# Patient Record
Sex: Female | Born: 1980 | Race: White | Hispanic: Yes | Smoking: Never smoker
Health system: Southern US, Community
[De-identification: ages and names within clinical notes are randomized; demographics above are authoritative.]

## PROBLEM LIST (undated history)

## (undated) DIAGNOSIS — D649 Anemia, unspecified: Secondary | ICD-10-CM

## (undated) DIAGNOSIS — C539 Malignant neoplasm of cervix uteri, unspecified: Secondary | ICD-10-CM

## (undated) HISTORY — DX: Anemia, unspecified: D64.9

## (undated) HISTORY — DX: Malignant neoplasm of cervix uteri, unspecified: C53.9

---

## 2005-03-06 ENCOUNTER — Other Ambulatory Visit: Admission: RE | Admit: 2005-03-06 | Discharge: 2005-03-06 | Payer: Self-pay | Admitting: Obstetrics and Gynecology

## 2005-05-15 ENCOUNTER — Inpatient Hospital Stay (HOSPITAL_COMMUNITY): Admission: AD | Admit: 2005-05-15 | Discharge: 2005-05-15 | Payer: Self-pay | Admitting: *Deleted

## 2005-05-15 ENCOUNTER — Ambulatory Visit: Payer: Self-pay | Admitting: Obstetrics and Gynecology

## 2005-07-15 ENCOUNTER — Ambulatory Visit: Payer: Self-pay | Admitting: Family Medicine

## 2005-07-15 ENCOUNTER — Inpatient Hospital Stay (HOSPITAL_COMMUNITY): Admission: AD | Admit: 2005-07-15 | Discharge: 2005-07-17 | Payer: Self-pay | Admitting: *Deleted

## 2009-05-25 ENCOUNTER — Inpatient Hospital Stay (HOSPITAL_COMMUNITY): Admission: AD | Admit: 2009-05-25 | Discharge: 2009-05-27 | Payer: Self-pay | Admitting: Obstetrics
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
## 2011-03-02 LAB — CBC
HCT: 24.9 % — ABNORMAL LOW (ref 36.0–46.0)
HCT: 38.2 % (ref 36.0–46.0)
Hemoglobin: 13.4 g/dL (ref 12.0–15.0)
Hemoglobin: 8.8 g/dL — ABNORMAL LOW (ref 12.0–15.0)
MCHC: 34.9 g/dL (ref 30.0–36.0)
MCHC: 35.3 g/dL (ref 30.0–36.0)
MCV: 95.3 fL (ref 78.0–100.0)
MCV: 96.8 fL (ref 78.0–100.0)
RBC: 4.01 MIL/uL (ref 3.87–5.11)
RDW: 12.3 % (ref 11.5–15.5)
WBC: 8.6 10*3/uL (ref 4.0–10.5)

## 2022-12-17 ENCOUNTER — Emergency Department (HOSPITAL_COMMUNITY)
Admission: EM | Admit: 2022-12-17 | Discharge: 2022-12-17 | Disposition: A | Discharge status: Home / Self Care | Payer: No Typology Code available for payment source | Attending: Emergency Medicine | Admitting: Emergency Medicine

## 2022-12-17 ENCOUNTER — Emergency Department (HOSPITAL_COMMUNITY): Payer: No Typology Code available for payment source

## 2022-12-17 ENCOUNTER — Encounter (HOSPITAL_COMMUNITY): Payer: Self-pay

## 2022-12-17 ENCOUNTER — Other Ambulatory Visit: Payer: Self-pay

## 2022-12-17 DIAGNOSIS — S39012A Strain of muscle, fascia and tendon of lower back, initial encounter: Secondary | ICD-10-CM

## 2022-12-17 DIAGNOSIS — M545 Low back pain, unspecified: Secondary | ICD-10-CM | POA: Diagnosis not present

## 2022-12-17 DIAGNOSIS — Y92410 Unspecified street and highway as the place of occurrence of the external cause: Secondary | ICD-10-CM | POA: Insufficient documentation

## 2022-12-17 DIAGNOSIS — D649 Anemia, unspecified: Secondary | ICD-10-CM

## 2022-12-17 LAB — CBC WITH DIFFERENTIAL/PLATELET
Abs Immature Granulocytes: 0.02 10*3/uL (ref 0.00–0.07)
Basophils Absolute: 0 10*3/uL (ref 0.0–0.1)
Basophils Relative: 0 %
Eosinophils Absolute: 0 10*3/uL (ref 0.0–0.5)
Eosinophils Relative: 1 %
HCT: 26.3 % — ABNORMAL LOW (ref 36.0–46.0)
Hemoglobin: 6.9 g/dL — CL (ref 12.0–15.0)
Immature Granulocytes: 0 %
Lymphocytes Relative: 32 %
Lymphs Abs: 2 10*3/uL (ref 0.7–4.0)
MCH: 17.3 pg — ABNORMAL LOW (ref 26.0–34.0)
MCHC: 26.2 g/dL — ABNORMAL LOW (ref 30.0–36.0)
MCV: 65.9 fL — ABNORMAL LOW (ref 80.0–100.0)
Monocytes Absolute: 0.5 10*3/uL (ref 0.1–1.0)
Monocytes Relative: 9 %
Neutro Abs: 3.6 10*3/uL (ref 1.7–7.7)
Neutrophils Relative %: 58 %
Platelets: 396 10*3/uL (ref 150–400)
RBC: 3.99 MIL/uL (ref 3.87–5.11)
RDW: 20.5 % — ABNORMAL HIGH (ref 11.5–15.5)
WBC: 6.2 10*3/uL (ref 4.0–10.5)
nRBC: 0 % (ref 0.0–0.2)

## 2022-12-17 LAB — BASIC METABOLIC PANEL
Anion gap: 9 (ref 5–15)
BUN: 7 mg/dL (ref 6–20)
CO2: 22 mmol/L (ref 22–32)
Calcium: 8.9 mg/dL (ref 8.9–10.3)
Chloride: 102 mmol/L (ref 98–111)
Creatinine, Ser: 0.43 mg/dL — ABNORMAL LOW (ref 0.44–1.00)
GFR, Estimated: >60 mL/min (ref >60)
Glucose, Bld: 98 mg/dL (ref 70–99)
Potassium: 3.3 mmol/L — ABNORMAL LOW (ref 3.5–5.1)
Sodium: 133 mmol/L — ABNORMAL LOW (ref 135–145)

## 2022-12-17 LAB — I-STAT BETA HCG BLOOD, ED (MC, WL, AP ONLY): I-stat hCG, quantitative: <5 m[IU]/mL (ref <5)

## 2022-12-17 MED ORDER — METHOCARBAMOL 500 MG PO TABS: 500.0000 mg | ORAL_TABLET | Freq: Two times a day (BID) | ORAL | 0 refills | Status: DC

## 2022-12-17 MED ORDER — FERROUS SULFATE 325 (65 FE) MG PO TABS: 325.0000 mg | ORAL_TABLET | Freq: Every day | ORAL | 0 refills | Status: DC

## 2022-12-17 MED ORDER — LIDOCAINE 5 % EX PTCH: 1.0000 | MEDICATED_PATCH | CUTANEOUS | 0 refills | Status: DC
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
## 2024-11-18 ENCOUNTER — Emergency Department (HOSPITAL_COMMUNITY): Payer: Self-pay

## 2024-11-18 ENCOUNTER — Observation Stay (HOSPITAL_COMMUNITY)
Admission: EM | Admit: 2024-11-18 | Discharge: 2024-11-19 | Disposition: A | Discharge status: Home / Self Care | Payer: Self-pay | Attending: Obstetrics & Gynecology | Admitting: Obstetrics & Gynecology

## 2024-11-18 ENCOUNTER — Other Ambulatory Visit: Payer: Self-pay

## 2024-11-18 DIAGNOSIS — Z8541 Personal history of malignant neoplasm of cervix uteri: Secondary | ICD-10-CM | POA: Insufficient documentation

## 2024-11-18 DIAGNOSIS — R1900 Intra-abdominal and pelvic swelling, mass and lump, unspecified site: Secondary | ICD-10-CM | POA: Insufficient documentation

## 2024-11-18 DIAGNOSIS — D500 Iron deficiency anemia secondary to blood loss (chronic): Secondary | ICD-10-CM

## 2024-11-18 DIAGNOSIS — N921 Excessive and frequent menstruation with irregular cycle: Secondary | ICD-10-CM | POA: Diagnosis present

## 2024-11-18 DIAGNOSIS — D649 Anemia, unspecified: Principal | ICD-10-CM | POA: Diagnosis present

## 2024-11-18 DIAGNOSIS — N939 Abnormal uterine and vaginal bleeding, unspecified: Principal | ICD-10-CM

## 2024-11-18 DIAGNOSIS — D509 Iron deficiency anemia, unspecified: Secondary | ICD-10-CM | POA: Diagnosis present

## 2024-11-18 LAB — COMPREHENSIVE METABOLIC PANEL WITH GFR
ALT: 95 U/L — ABNORMAL HIGH (ref 0–44)
AST: 74 U/L — ABNORMAL HIGH (ref 15–41)
Albumin: 4 g/dL (ref 3.5–5.0)
Alkaline Phosphatase: 97 U/L (ref 38–126)
Anion gap: 9 (ref 5–15)
BUN: 9 mg/dL (ref 6–20)
CO2: 24 mmol/L (ref 22–32)
Calcium: 8.7 mg/dL — ABNORMAL LOW (ref 8.9–10.3)
Chloride: 103 mmol/L (ref 98–111)
Creatinine, Ser: 0.42 mg/dL — ABNORMAL LOW (ref 0.44–1.00)
GFR, Estimated: >60 mL/min
Glucose, Bld: 102 mg/dL — ABNORMAL HIGH (ref 70–99)
Potassium: 3.7 mmol/L (ref 3.5–5.1)
Sodium: 136 mmol/L (ref 135–145)
Total Bilirubin: 0.4 mg/dL (ref 0.0–1.2)
Total Protein: 7.7 g/dL (ref 6.5–8.1)

## 2024-11-18 LAB — CBC WITH DIFFERENTIAL/PLATELET
Abs Immature Granulocytes: 0.05 K/uL (ref 0.00–0.07)
Basophils Absolute: 0 K/uL (ref 0.0–0.1)
Basophils Relative: 0 %
Eosinophils Absolute: 0.1 K/uL (ref 0.0–0.5)
Eosinophils Relative: 1 %
HCT: 21.6 % — ABNORMAL LOW (ref 36.0–46.0)
Hemoglobin: 5.4 g/dL — CL (ref 12.0–15.0)
Immature Granulocytes: 1 %
Lymphocytes Relative: 19 %
Lymphs Abs: 1.5 K/uL (ref 0.7–4.0)
MCH: 17.1 pg — ABNORMAL LOW (ref 26.0–34.0)
MCHC: 25 g/dL — ABNORMAL LOW (ref 30.0–36.0)
MCV: 68.4 fL — ABNORMAL LOW (ref 80.0–100.0)
Monocytes Absolute: 0.5 K/uL (ref 0.1–1.0)
Monocytes Relative: 6 %
Neutro Abs: 5.9 K/uL (ref 1.7–7.7)
Neutrophils Relative %: 73 %
Platelets: 311 K/uL (ref 150–400)
RBC: 3.16 MIL/uL — ABNORMAL LOW (ref 3.87–5.11)
RDW: 20.5 % — ABNORMAL HIGH (ref 11.5–15.5)
WBC: 8.1 K/uL (ref 4.0–10.5)
nRBC: 0.2 % (ref 0.0–0.2)

## 2024-11-18 LAB — HEMOGLOBIN AND HEMATOCRIT, BLOOD
HCT: 20.4 % — ABNORMAL LOW (ref 36.0–46.0)
Hemoglobin: 5.2 g/dL — CL (ref 12.0–15.0)

## 2024-11-18 LAB — HCG, SERUM, QUALITATIVE: Preg, Serum: NEGATIVE

## 2024-11-18 LAB — ABO/RH: ABO/RH(D): O POS

## 2024-11-18 LAB — PREPARE RBC (CROSSMATCH)

## 2024-11-18 MED ORDER — SODIUM CHLORIDE 0.9% IV SOLUTION: Freq: Once | INTRAVENOUS | Status: DC

## 2024-11-18 NOTE — ED Triage Notes (Signed)
 PT complains of extremely heavy menstrual cycle x 4 days with SOB and dizziness. Pt has been wearing a diaper due to filling pads so freq.

## 2024-11-18 NOTE — ED Triage Notes (Signed)
 Patient reports 4 days of heavy menstrual bleeding with no abdominal pain, patient wearing depends at this time d/t amount of bleeding, unable to use pads b/c they filled up so fast. Patient also dizzy.

## 2024-11-18 NOTE — ED Provider Notes (Signed)
 " [place] EMERGENCY DEPARTMENT AT [facility] Provider Note   CSN: [id number] Arrival date & time: 11/18/24  2002     Patient presents with: Vaginal Bleeding   Yolanda Braun is a 43 y.o. female.   43 yo female presents her daughter who provides Spanish translation (patient declines hospital translator at this time) with concern for heavy vaginal bleeding x 4 days. Reports this cycle she is passing larger clots than normal, wearing diapers due to quantity of blood and large clots. Feels dizzy when she is having bleeding/passing a clot otherwise is not SHOB, dizzy, or having CP. Reports cycles typically every month, lasting 7 days. Does not see GYN.        Prior to Admission medications  Medication Sig Start Date End Date Taking? Authorizing Provider  acetaminophen (TYLENOL) 500 MG tablet Take 1,000 mg by mouth every 6 (six) hours as needed for headache or mild pain (pain score 1-3).   Yes [provider]  ibuprofen (ADVIL) 200 MG tablet Take 600 mg by mouth every 6 (six) hours as needed for headache, mild pain (pain score 1-3) or cramping.   Yes [provider]  Multiple Vitamins-Minerals (HAIR/SKIN/NAILS) TABS Take 1 tablet by mouth in the morning.   Yes [provider]  OVER THE COUNTER MEDICATION Take 1 Scoop by mouth in the morning. BLOOM GREENS AND SUPERFOODS: for digestion, bloating, and energy   Yes [provider]    Allergies: Patient has no known allergies.    Review of Systems Negative except as per HPI Updated Vital Signs BP 122/75   Pulse 88   Temp 98.2 F (36.8 C) (Oral)   Resp (!) 25   Wt 101 kg   SpO2 100%   BMI 43.49 kg/m   Physical Exam Vitals and nursing note reviewed. Exam conducted with a chaperone present.  Constitutional:      General: She is not in acute distress.    Appearance: She is well-developed. She is not diaphoretic.  HENT:     Head: Normocephalic and atraumatic.  Cardiovascular:      Rate and Rhythm: Regular rhythm. Tachycardia present.     Heart sounds: Normal heart sounds.  Pulmonary:     Effort: Pulmonary effort is normal.     Breath sounds: Normal breath sounds.  Abdominal:     Palpations: Abdomen is soft.     Tenderness: There is no abdominal tenderness.  Genitourinary:    Comments: Exam limited by habitus, appears to have irregular tissue/mass extending off the cervix posteriorly, difficult to visualize on exam. Not significantly bleeding at this time.  Skin:    General: Skin is warm and dry.     Findings: No erythema or rash.  Neurological:     Mental Status: She is alert and oriented to person, place, and time.  Psychiatric:        Behavior: Behavior normal.     (all labs ordered are listed, but only abnormal results are displayed) Labs Reviewed  CBC WITH DIFFERENTIAL/PLATELET - Abnormal; Notable for the following components:      Result Value   RBC 3.16 (*)    Hemoglobin 5.4 (*)    HCT 21.6 (*)    MCV 68.4 (*)    MCH 17.1 (*)    MCHC 25.0 (*)    RDW 20.5 (*)    All other components within normal limits  HEMOGLOBIN AND HEMATOCRIT, BLOOD - Abnormal; Notable for the following components:   Hemoglobin 5.2 (*)  HCT 20.4 (*)    All other components within normal limits  COMPREHENSIVE METABOLIC PANEL WITH GFR - Abnormal; Notable for the following components:   Glucose, Bld 102 (*)    Creatinine, Ser 0.42 (*)    Calcium 8.7 (*)    AST 74 (*)    ALT 95 (*)    All other components within normal limits  HCG, SERUM, QUALITATIVE  URINALYSIS, ROUTINE W REFLEX MICROSCOPIC  TYPE AND SCREEN  ABO/RH  PREPARE RBC (CROSSMATCH)    EKG: None  Radiology: US  PELVIC COMPLETE WITH TRANSVAGINAL Result Date: 11/19/2024 EXAM: US  Pelvis, Complete Transvaginal and Transabdominal without Doppler TECHNIQUE: Transabdominal and transvaginal pelvic duplex ultrasound using B-mode/gray scaled imaging without Doppler spectral analysis and color flow was obtained.  COMPARISON: None provided CLINICAL HISTORY: Vaginal bleeding, anemia. FINDINGS: UTERUS: Uterus measures 9.2 x 4.8 x 6.1 cm. Uterus demonstrates normal myometrial echotexture. There is a 3.5 cm focal hypodensity area within the cervical region measuring 3.3 x 2.2 x 5.0 cm. Additionally, there are nabothian cysts within the cervix. ENDOMETRIAL STRIPE: Endometrium measures 0.9 cm. Endometrial stripe is within normal limits. RIGHT OVARY: Right ovary measures 3.9 x 2.8 x 2.6 cm. There are cystic areas in the right ovary measuring up to 2.3 cm. LEFT OVARY: Left ovary measures 5.8 x 3.9 x 4.4 cm. There is a well circumscribed homogeneous isoechoic structure within the left ovary measuring 5.2 x 3.2 x 3.5 cm without significant internal vascularity. FREE FLUID: No free fluid. IMPRESSION: 1. Indeterminate well-circumscribed homogeneous avascular left ovarian lesion measuring 5.2 x 3.2 x 3.5 cm, with pelvic MRI with IV contrast recommended for further characterization. 2. Indeterminate cervical region lesion measuring up to 5.0 cm, with gynecology evaluation and/or pelvic MRI with IV contrast recommended for further characterization. 3. Small right ovarian cysts measuring up to 2.3 cm, compatible with follicles and requiring no follow-up imaging. Electronically signed by: Greig Pique MD 11/19/2024 12:39 AM EST RP Workstation: [id number]     .Critical Care  Performed by: Beverley Leita LABOR, PA-C Authorized by: Beverley Leita LABOR, PA-C   Critical care provider statement:    Critical care time (minutes):  30   Critical care was time spent personally by me on the following activities:  Development of treatment plan with patient or surrogate, discussions with consultants, evaluation of patient's response to treatment, examination of patient, ordering and review of laboratory studies, ordering and review of radiographic studies, ordering and performing treatments and interventions, pulse oximetry, re-evaluation of patient's  condition and review of old charts    Medications Ordered in the ED  0.9 %  sodium chloride  infusion (Manually program via Guardrails IV Fluids) (has no administration in time range)  conjugated estrogens  (PREMARIN ) injection 25 mg (has no administration in time range)  megestrol  (MEGACE ) tablet 120 mg (120 mg Oral Given 11/19/24 0229)                                     Medical Decision Making Amount and/or Complexity of Data Reviewed Labs: ordered. Radiology: ordered.  Risk Prescription drug management. Decision regarding hospitalization.   This patient presents to the ED for concern of vaginal bleeding, this involves an extensive number of treatment options, and is a complaint that carries with it a high risk of complications and morbidity.  The differential diagnosis includes but not limited to symptomatic anemia, acute blood loss anemia, cervical mass   Co morbidities /  Chronic conditions that complicate the patient evaluation  Anemia, history of heavy menstrual cycles   Additional history obtained:  Additional history obtained from EMR External records from outside source obtained and reviewed including prior labs on file   Lab Tests:  I Ordered, and personally interpreted labs.  The pertinent results include: CBC with hemoglobin of 5.4, stable on recheck at 5.2.  CMP with mildly elevated LFTs with AST of 74 and ALT of 95.   Imaging Studies ordered:  I ordered imaging studies including pelvic US   I independently visualized and interpreted imaging which showed mass at cervix  I agree with the radiologist interpretation    Problem List / ED Course / Critical interventions / Medication management  43 yo female here with daughter for heavy vaginal bleeding, heavier and with larger clots that her typical, wearing pads today, feeling dizzy when she has episodes of heavy blood loss. Vitals stable. Anemic at baseline, now with hgb 5.4. Consents to blood transfusion.  Found to have mass on her cervix on exam, US  with concern for mass as well as lesion at left ovary. Admitted for transfusion and further care. Discussed results with patient with family at bedside with her consent and former translator assistance.  I ordered medication including premarin , megestrol , PRBC   Reevaluation of the patient after these medicines showed that the patient stable I have reviewed the patients home medicines and have made adjustments as needed   Consultations Obtained:  I requested consultation with the Dr. Jayne,  and discussed lab and imaging findings as well as pertinent plan - they recommend: admit, IV Premarin  25mg , PO Megestrol  120mg    Social Determinants of Health:  No PCP, lives with family   Test / Admission - Considered:  Admit      Final diagnoses:  Vaginal bleeding  Pelvic mass  Blood loss anemia    ED Discharge Orders     None          Beverley Leita LABOR, PA-C 11/19/24 0246    Theadore Ozell HERO, MD 11/19/24 [phone]  "

## 2024-11-18 NOTE — ED Notes (Signed)
Patient going to Ultrasound

## 2024-11-19 ENCOUNTER — Other Ambulatory Visit (HOSPITAL_COMMUNITY): Payer: Self-pay

## 2024-11-19 ENCOUNTER — Encounter (HOSPITAL_COMMUNITY): Payer: Self-pay | Admitting: Obstetrics & Gynecology

## 2024-11-19 DIAGNOSIS — D500 Iron deficiency anemia secondary to blood loss (chronic): Secondary | ICD-10-CM

## 2024-11-19 DIAGNOSIS — D509 Iron deficiency anemia, unspecified: Secondary | ICD-10-CM | POA: Diagnosis present

## 2024-11-19 DIAGNOSIS — D649 Anemia, unspecified: Secondary | ICD-10-CM | POA: Diagnosis present

## 2024-11-19 DIAGNOSIS — C531 Malignant neoplasm of exocervix: Secondary | ICD-10-CM

## 2024-11-19 DIAGNOSIS — N921 Excessive and frequent menstruation with irregular cycle: Secondary | ICD-10-CM

## 2024-11-19 LAB — CBC
HCT: 26.1 % — ABNORMAL LOW (ref 36.0–46.0)
Hemoglobin: 7.6 g/dL — ABNORMAL LOW (ref 12.0–15.0)
MCH: 20.7 pg — ABNORMAL LOW (ref 26.0–34.0)
MCHC: 29.1 g/dL — ABNORMAL LOW (ref 30.0–36.0)
MCV: 71.1 fL — ABNORMAL LOW (ref 80.0–100.0)
Platelets: 280 K/uL (ref 150–400)
RBC: 3.67 MIL/uL — ABNORMAL LOW (ref 3.87–5.11)
RDW: 22.5 % — ABNORMAL HIGH (ref 11.5–15.5)
WBC: 6.8 K/uL (ref 4.0–10.5)
nRBC: 0.4 % — ABNORMAL HIGH (ref 0.0–0.2)

## 2024-11-19 MED ORDER — ESTROGENS CONJUGATED 25 MG IJ SOLR
25.0000 mg | Freq: Four times a day (QID) | INTRAMUSCULAR | Status: DC
  Administered 2024-11-19: 25 mg via INTRAVENOUS
  Filled 2024-11-19 (×3): qty 25

## 2024-11-19 MED ORDER — MEGESTROL ACETATE 40 MG PO TABS
120.0000 mg | ORAL_TABLET | Freq: Every day | ORAL | Status: DC
  Filled 2024-11-19: qty 3

## 2024-11-19 MED ORDER — PRENATAL MULTIVITAMIN CH: 1.0000 | ORAL_TABLET | Freq: Every day | ORAL | Status: DC

## 2024-11-19 MED ORDER — MEGESTROL ACETATE 40 MG PO TABS
120.0000 mg | ORAL_TABLET | Freq: Every day | ORAL | 1 refills | Status: AC
  Filled 2024-11-19: qty 90, 30d supply, fill #0

## 2024-11-19 MED ORDER — ESTROGENS CONJUGATED 25 MG IJ SOLR
25.0000 mg | Freq: Once | INTRAMUSCULAR | Status: AC
  Administered 2024-11-19: 25 mg via INTRAVENOUS
  Filled 2024-11-19: qty 25

## 2024-11-19 MED ORDER — MEGESTROL ACETATE 40 MG PO TABS
120.0000 mg | ORAL_TABLET | Freq: Once | ORAL | Status: AC
  Administered 2024-11-19: 120 mg via ORAL
  Filled 2024-11-19: qty 3

## 2024-11-19 NOTE — Progress Notes (Signed)
 Patient ID: Yolanda Braun, female   DOB: August 18, 1981, 43 y.o.   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

## 2024-11-19 NOTE — Plan of Care (Signed)
" °  Problem: Education: Goal: Knowledge of General Education information will improve Description: Including pain rating scale, medication(s)/side effects and non-pharmacologic comfort measures 11/19/2024 1536 by Knute Suzen RAMAN, RN Outcome: Completed/Met 11/19/2024 1223 by Knute Suzen RAMAN, RN Outcome: Progressing 11/19/2024 0855 by Knute Suzen RAMAN, RN Outcome: Progressing   Problem: Health Behavior/Discharge Planning: Goal: Ability to manage health-related needs will improve 11/19/2024 1536 by Knute Suzen RAMAN, RN Outcome: Completed/Met 11/19/2024 1223 by Knute Suzen RAMAN, RN Outcome: Progressing 11/19/2024 0855 by Knute Suzen RAMAN, RN Outcome: Progressing   Problem: Clinical Measurements: Goal: Ability to maintain clinical measurements within normal limits will improve 11/19/2024 1536 by Knute Suzen RAMAN, RN Outcome: Completed/Met 11/19/2024 1223 by Knute Suzen RAMAN, RN Outcome: Progressing 11/19/2024 0855 by Knute Suzen RAMAN, RN Outcome: Progressing Goal: Will remain free from infection 11/19/2024 1536 by Knute Suzen RAMAN, RN Outcome: Completed/Met 11/19/2024 1223 by Knute Suzen RAMAN, RN Outcome: Progressing 11/19/2024 0855 by Knute Suzen RAMAN, RN Outcome: Progressing Goal: Diagnostic test results will improve 11/19/2024 1536 by Knute Suzen RAMAN, RN Outcome: Completed/Met 11/19/2024 1223 by Knute Suzen RAMAN, RN Outcome: Progressing 11/19/2024 0855 by Knute Suzen RAMAN, RN Outcome: Progressing Goal: Respiratory complications will improve 11/19/2024 1536 by Knute Suzen RAMAN, RN Outcome: Completed/Met 11/19/2024 1223 by Knute Suzen RAMAN, RN Outcome: Progressing 11/19/2024 0855 by Knute Suzen RAMAN, RN Outcome: Progressing Goal: Cardiovascular complication will be avoided 11/19/2024 1536 by Knute Suzen RAMAN, RN Outcome: Completed/Met 11/19/2024 1223 by Knute Suzen RAMAN, RN Outcome: Progressing 11/19/2024 0855 by Knute Suzen RAMAN, RN Outcome: Progressing   Problem: Activity: Goal: Risk for activity intolerance will decrease 11/19/2024 1536 by Knute Suzen RAMAN, RN Outcome: Completed/Met 11/19/2024 1223 by Knute Suzen RAMAN, RN Outcome: Progressing 11/19/2024 0855 by Knute Suzen RAMAN, RN Outcome: Progressing   Problem: Nutrition: Goal: Adequate nutrition will be maintained 11/19/2024 1536 by Knute Suzen RAMAN, RN Outcome: Completed/Met 11/19/2024 1223 by Knute Suzen RAMAN, RN Outcome: Progressing 11/19/2024 0855 by Knute Suzen RAMAN, RN Outcome: Progressing   Problem: Coping: Goal: Level of anxiety will decrease 11/19/2024 1536 by Knute Suzen RAMAN, RN Outcome: Completed/Met 11/19/2024 1223 by Knute Suzen RAMAN, RN Outcome: Progressing 11/19/2024 0855 by Knute Suzen RAMAN, RN Outcome: Progressing   Problem: Elimination: Goal: Will not experience complications related to bowel motility 11/19/2024 1536 by Knute Suzen RAMAN, RN Outcome: Completed/Met 11/19/2024 1223 by Knute Suzen RAMAN, RN Outcome: Progressing 11/19/2024 0855 by Knute Suzen RAMAN, RN Outcome: Progressing Goal: Will not experience complications related to urinary retention 11/19/2024 1536 by Knute Suzen RAMAN, RN Outcome: Completed/Met 11/19/2024 1223 by Knute Suzen RAMAN, RN Outcome: Progressing 11/19/2024 0855 by Knute Suzen RAMAN, RN Outcome: Progressing   Problem: Pain Managment: Goal: General experience of comfort will improve and/or be controlled 11/19/2024 1536 by Knute Suzen RAMAN, RN Outcome: Completed/Met 11/19/2024 1223 by Knute Suzen RAMAN, RN Outcome: Progressing 11/19/2024 0855 by Knute Suzen RAMAN, RN Outcome: Progressing   Problem: Safety: Goal: Ability to remain free from injury will improve 11/19/2024 1536 by Knute Suzen RAMAN, RN Outcome: Completed/Met 11/19/2024 1223 by Knute Suzen RAMAN, RN Outcome: Progressing 11/19/2024 0855 by Knute Suzen RAMAN, RN Outcome:  Progressing   Problem: Skin Integrity: Goal: Risk for impaired skin integrity will decrease 11/19/2024 1536 by Knute Suzen RAMAN, RN Outcome: Completed/Met 11/19/2024 1223 by Knute Suzen RAMAN, RN Outcome: Progressing 11/19/2024 0855 by Knute Suzen RAMAN, RN Outcome: Progressing   "

## 2024-11-19 NOTE — Plan of Care (Signed)
" °  Problem: Education: Goal: Knowledge of General Education information will improve Description: Including pain rating scale, medication(s)/side effects and non-pharmacologic comfort measures 11/19/2024 1223 by Knute Suzen RAMAN, RN Outcome: Progressing 11/19/2024 0855 by Knute Suzen RAMAN, RN Outcome: Progressing   Problem: Health Behavior/Discharge Planning: Goal: Ability to manage health-related needs will improve 11/19/2024 1223 by Knute Suzen RAMAN, RN Outcome: Progressing 11/19/2024 0855 by Knute Suzen RAMAN, RN Outcome: Progressing   Problem: Clinical Measurements: Goal: Ability to maintain clinical measurements within normal limits will improve 11/19/2024 1223 by Knute Suzen RAMAN, RN Outcome: Progressing 11/19/2024 0855 by Knute Suzen RAMAN, RN Outcome: Progressing Goal: Will remain free from infection 11/19/2024 1223 by Knute Suzen RAMAN, RN Outcome: Progressing 11/19/2024 0855 by Knute Suzen RAMAN, RN Outcome: Progressing Goal: Diagnostic test results will improve 11/19/2024 1223 by Knute Suzen RAMAN, RN Outcome: Progressing 11/19/2024 0855 by Knute Suzen RAMAN, RN Outcome: Progressing Goal: Respiratory complications will improve 11/19/2024 1223 by Knute Suzen RAMAN, RN Outcome: Progressing 11/19/2024 0855 by Knute Suzen RAMAN, RN Outcome: Progressing Goal: Cardiovascular complication will be avoided 11/19/2024 1223 by Knute Suzen RAMAN, RN Outcome: Progressing 11/19/2024 0855 by Knute Suzen RAMAN, RN Outcome: Progressing   Problem: Activity: Goal: Risk for activity intolerance will decrease 11/19/2024 1223 by Knute Suzen RAMAN, RN Outcome: Progressing 11/19/2024 0855 by Knute Suzen RAMAN, RN Outcome: Progressing   Problem: Nutrition: Goal: Adequate nutrition will be maintained 11/19/2024 1223 by Knute Suzen RAMAN, RN Outcome: Progressing 11/19/2024 0855 by Knute Suzen RAMAN, RN Outcome: Progressing   Problem: Coping: Goal: Level  of anxiety will decrease 11/19/2024 1223 by Knute Suzen RAMAN, RN Outcome: Progressing 11/19/2024 0855 by Knute Suzen RAMAN, RN Outcome: Progressing   Problem: Elimination: Goal: Will not experience complications related to bowel motility 11/19/2024 1223 by Knute Suzen RAMAN, RN Outcome: Progressing 11/19/2024 0855 by Knute Suzen RAMAN, RN Outcome: Progressing Goal: Will not experience complications related to urinary retention 11/19/2024 1223 by Knute Suzen RAMAN, RN Outcome: Progressing 11/19/2024 0855 by Knute Suzen RAMAN, RN Outcome: Progressing   Problem: Pain Managment: Goal: General experience of comfort will improve and/or be controlled 11/19/2024 1223 by Knute Suzen RAMAN, RN Outcome: Progressing 11/19/2024 0855 by Knute Suzen RAMAN, RN Outcome: Progressing   Problem: Safety: Goal: Ability to remain free from injury will improve 11/19/2024 1223 by Knute Suzen RAMAN, RN Outcome: Progressing 11/19/2024 0855 by Knute Suzen RAMAN, RN Outcome: Progressing   Problem: Skin Integrity: Goal: Risk for impaired skin integrity will decrease 11/19/2024 1223 by Knute Suzen RAMAN, RN Outcome: Progressing 11/19/2024 0855 by Knute Suzen RAMAN, RN Outcome: Progressing   "

## 2024-11-19 NOTE — Plan of Care (Signed)

## 2024-11-19 NOTE — H&P (Signed)
 "  History and Physical  Yolanda Braun is a 43 y.o. No obstetric history on file. with No LMP recorded. admitted for a heavy menstrual bleeding, chronic, with IDA.  Sonogram reveals bulky cervix  PMH:   History reviewed. No pertinent past medical history.  PSH:    History reviewed. No pertinent surgical history.  POb/GynH:      OB History   No obstetric history on file.     SH:  Social History[1]  FH:   History reviewed. No pertinent family history.   Allergies: Allergies[2]  Medications:      Current Medications[3]  Review of Systems:   Review of Systems  Constitutional: Negative for fever, chills, weight loss, malaise/fatigue and diaphoresis.  HENT: Negative for hearing loss, ear pain, nosebleeds, congestion, sore throat, neck pain, tinnitus and ear discharge.   Eyes: Negative for blurred vision, double vision, photophobia, pain, discharge and redness.  Respiratory: Negative for cough, hemoptysis, sputum production, shortness of breath, wheezing and stridor.   Cardiovascular: Negative for chest pain, palpitations, orthopnea, claudication, leg swelling and PND.  Gastrointestinal: Positive for abdominal pain. Negative for heartburn, nausea, vomiting, diarrhea, constipation, blood in stool and melena.  Genitourinary: Negative for dysuria, urgency, frequency, hematuria and flank pain.  Musculoskeletal: Negative for myalgias, back pain, joint pain and falls.  Skin: Negative for itching and rash.  Neurological: Negative for dizziness, tingling, tremors, sensory change, speech change, focal weakness, seizures, loss of consciousness, weakness and headaches.  Endo/Heme/Allergies: Negative for environmental allergies and polydipsia. Does not bruise/bleed easily.  Psychiatric/Behavioral: Negative for depression, suicidal ideas, hallucinations, memory loss and substance abuse. The patient is not nervous/anxious and does not have insomnia.      PHYSICAL EXAM:  Blood pressure  132/76, pulse 78, temperature 98.7 F (37.1 C), temperature source Oral, resp. rate 17, weight 101 kg, SpO2 99%.    Vitals reviewed. Constitutional: She is oriented to person, place, and time. She appears well-developed and well-nourished.  HENT:  Head: Normocephalic and atraumatic.  Right Ear: External ear normal.  Left Ear: External ear normal.  Nose: Nose normal.  Mouth/Throat: Oropharynx is clear and moist.  Eyes: Conjunctivae and EOM are normal. Pupils are equal, round, and reactive to light. Right eye exhibits no discharge. Left eye exhibits no discharge. No scleral icterus.  Neck: Normal range of motion. Neck supple. No tracheal deviation present. No thyromegaly present.  Cardiovascular: Normal rate, regular rhythm, normal heart sounds and intact distal pulses.  Exam reveals no gallop and no friction rub.   No murmur heard. Respiratory: Effort normal and breath sounds normal. No respiratory distress. She has no wheezes. She has no rales. She exhibits no tenderness.  GI: Soft. Bowel sounds are normal. She exhibits no distension and no mass. There is tenderness. There is no rebound and no guarding.  Genitourinary:       Vulva is normal without lesions Vagina is pink moist without discharge Cervix bulky with posterior lesion consistent with cervical cancer, see biopsy note Uterus is normal size, contour, position, consistency, mobility, non-tender Adnexa is negative with normal sized ovaries by sonogram  Musculoskeletal: Normal range of motion. She exhibits no edema and no tenderness.  Neurological: She is alert and oriented to person, place, and time. She has normal reflexes. She displays normal reflexes. No cranial nerve deficit. She exhibits normal muscle tone. Coordination normal.  Skin: Skin is warm and dry. No rash noted. No erythema. No pallor.  Psychiatric: She has a normal mood and affect. Her behavior is  normal. Judgment and thought content normal.    Labs: Results for  orders placed or performed during the hospital encounter of 11/18/24 (from the past 2 weeks)  CBC with Differential/Platelet   Collection Time: 11/18/24  8:34 PM  Result Value Ref Range   WBC 8.1 4.0 - 10.5 K/uL   RBC 3.16 (L) 3.87 - 5.11 MIL/uL   Hemoglobin 5.4 (LL) 12.0 - 15.0 g/dL   HCT 78.3 (L) 63.9 - 53.9 %   MCV 68.4 (L) 80.0 - 100.0 fL   MCH 17.1 (L) 26.0 - 34.0 pg   MCHC 25.0 (L) 30.0 - 36.0 g/dL   RDW 79.4 (H) 88.4 - 84.4 %   Platelets 311 150 - 400 K/uL   nRBC 0.2 0.0 - 0.2 %   Neutrophils Relative % 73 %   Neutro Abs 5.9 1.7 - 7.7 K/uL   Lymphocytes Relative 19 %   Lymphs Abs 1.5 0.7 - 4.0 K/uL   Monocytes Relative 6 %   Monocytes Absolute 0.5 0.1 - 1.0 K/uL   Eosinophils Relative 1 %   Eosinophils Absolute 0.1 0.0 - 0.5 K/uL   Basophils Relative 0 %   Basophils Absolute 0.0 0.0 - 0.1 K/uL   Immature Granulocytes 1 %   Abs Immature Granulocytes 0.05 0.00 - 0.07 K/uL  hCG, serum, qualitative   Collection Time: 11/18/24  8:34 PM  Result Value Ref Range   Preg, Serum NEGATIVE NEGATIVE  ABO/Rh   Collection Time: 11/18/24  8:34 PM  Result Value Ref Range   ABO/RH(D)      O POS Performed at [facility], [address], [place], [place] [address]   Prepare RBC (crossmatch)   Collection Time: 11/18/24 10:33 PM  Result Value Ref Range   Order Confirmation      ORDER PROCESSED BY BLOOD BANK Performed at [facility], [address] [place], [place] [address]   Hemoglobin and hematocrit, blood   Collection Time: 11/18/24 11:10 PM  Result Value Ref Range   Hemoglobin 5.2 (LL) 12.0 - 15.0 g/dL   HCT 79.5 (L) 63.9 - 53.9 %  Comprehensive metabolic panel   Collection Time: 11/18/24 11:10 PM  Result Value Ref Range   Sodium 136 135 - 145 mmol/L   Potassium 3.7 3.5 - 5.1 mmol/L   Chloride 103 98 - 111 mmol/L   CO2 24 22 - 32 mmol/L   Glucose, Bld 102 (H) 70 - 99 mg/dL   BUN 9 6 - 20 mg/dL   Creatinine, Ser 9.57 (L) 0.44 - 1.00 mg/dL   Calcium 8.7 (L)  8.9 - 10.3 mg/dL   Total Protein 7.7 6.5 - 8.1 g/dL   Albumin 4.0 3.5 - 5.0 g/dL   AST 74 (H) 15 - 41 U/L   ALT 95 (H) 0 - 44 U/L   Alkaline Phosphatase 97 38 - 126 U/L   Total Bilirubin 0.4 0.0 - 1.2 mg/dL   GFR, Estimated >39 >39 mL/min   Anion gap 9 5 - 15  Type and screen [facility]   Collection Time: 11/18/24 11:10 PM  Result Value Ref Range   ABO/RH(D) O POS    Antibody Screen NEG    Sample Expiration 11/21/2024,2359    Unit Number [id number]    Blood Component Type RED CELLS,LR    Unit division 00    Status of Unit ISSUED    Transfusion Status OK TO TRANSFUSE    Crossmatch Result  Compatible Performed at [facility], [address] [place], [place] [address]    Unit Number [id number]    Blood Component Type RED CELLS,LR    Unit division 00    Status of Unit ISSUED    Transfusion Status OK TO TRANSFUSE    Crossmatch Result Compatible   BPAM RBC   Collection Time: 11/18/24 11:10 PM  Result Value Ref Range   ISSUE DATE / TIME 797487729591    Blood Product Unit Number [id number]    PRODUCT CODE [id number]    Unit Type and Rh 5100    Blood Product Expiration Date 202601202359    ISSUE DATE / TIME 797487729965    Blood Product Unit Number [id number]    PRODUCT CODE [id number]    Unit Type and Rh 5100    Blood Product Expiration Date 797398797640     EKG: No orders found for this or any previous visit.  Imaging Studies: US  PELVIC COMPLETE WITH TRANSVAGINAL Result Date: 11/19/2024 EXAM: US  Pelvis, Complete Transvaginal and Transabdominal without Doppler TECHNIQUE: Transabdominal and transvaginal pelvic duplex ultrasound using B-mode/gray scaled imaging without Doppler spectral analysis and color flow was obtained. COMPARISON: None provided CLINICAL HISTORY: Vaginal bleeding, anemia. FINDINGS: UTERUS: Uterus measures 9.2 x 4.8 x 6.1 cm. Uterus demonstrates normal myometrial echotexture. There is a 3.5 cm focal hypodensity area  within the cervical region measuring 3.3 x 2.2 x 5.0 cm. Additionally, there are nabothian cysts within the cervix. ENDOMETRIAL STRIPE: Endometrium measures 0.9 cm. Endometrial stripe is within normal limits. RIGHT OVARY: Right ovary measures 3.9 x 2.8 x 2.6 cm. There are cystic areas in the right ovary measuring up to 2.3 cm. LEFT OVARY: Left ovary measures 5.8 x 3.9 x 4.4 cm. There is a well circumscribed homogeneous isoechoic structure within the left ovary measuring 5.2 x 3.2 x 3.5 cm without significant internal vascularity. FREE FLUID: No free fluid. IMPRESSION: 1. Indeterminate well-circumscribed homogeneous avascular left ovarian lesion measuring 5.2 x 3.2 x 3.5 cm, with pelvic MRI with IV contrast recommended for further characterization. 2. Indeterminate cervical region lesion measuring up to 5.0 cm, with gynecology evaluation and/or pelvic MRI with IV contrast recommended for further characterization. 3. Small right ovarian cysts measuring up to 2.3 cm, compatible with follicles and requiring no follow-up imaging. Electronically signed by: Greig Pique MD 11/19/2024 12:39 AM EST RP Workstation: [id number]      Assessment: Menometrorrhagia, chronic IDA, acute on chronic Cervical cancer  Plan: I believe both are true, cervical lesion is not bleeding See cervical biopsy note Transfused x 2 units, recheck at noon  If stable discharge on megestrol  120 mg daily and will follow up the biopsy result as an outpatient  I sent a copy of the path report to Dr Viktoria in anticipation of the patient needing to be seen by her  Vonn VEAR Inch 11/19/2024 10:19 AM           [1]  Social History Tobacco Use   Smoking status: Never   Smokeless tobacco: Never  Vaping Use   Vaping status: Every Day  Substance Use Topics   Alcohol use: Yes    Alcohol/week: 6.0 standard drinks of alcohol    Types: 6 Cans of beer per week   Drug use: Never  [2] No Known Allergies [3]  Current  Facility-Administered Medications:    0.9 %  sodium chloride  infusion (Manually program via Guardrails IV Fluids), , Intravenous, Once, Beverley Doffing A, PA-C   conjugated estrogens  (PREMARIN ) injection 25  mg, 25 mg, Intravenous, Q6H, Denver Bentson, Vonn DEL, MD   megestrol  (MEGACE ) tablet 120 mg, 120 mg, Oral, Daily, Pride Gonzales, Vonn DEL, MD   prenatal multivitamin tablet 1 tablet, 1 tablet, Oral, Q1200, Jayne Vonn DEL, MD  "

## 2024-11-19 NOTE — Discharge Summary (Signed)
 Physician Discharge Summary  Patient ID: Ellana Garcia-Ramirez MRN: [id number] DOB/AGE: 43-16-1982 43 y.o.  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 Sonogram revealed bulky cervix confirmed on my exam Posterior lesion concerning for cervical cancer, cervical biopsy was done  Bleeding stopped on premain IV + megestrol  120 qd  Consults:   Significant Diagnostic Studies: labs:  and radiology: Ultrasound: pelvis     Latest Ref Rng & Units 11/19/2024   12:06 PM 11/18/2024   11:10 PM 11/18/2024    8:34 PM  CBC  WBC 4.0 - 10.5 K/uL 6.8   8.1   Hemoglobin 12.0 - 15.0 g/dL 7.6  5.2  5.4   Hematocrit 36.0 - 46.0 % 26.1  20.4  21.6   Platelets 150 - 400 K/uL 280   311      Treatments: transfusion, bleeding management and cervical biopsy  Discharge Exam: Blood pressure 135/82, pulse 91, temperature 98.7 F (37.1 C), temperature source Oral, resp. rate 16, weight 101 kg, SpO2 98%. General appearance: alert, cooperative, and no distress GI: soft, non-tender; bowel sounds normal; no masses,  no organomegaly  Disposition: Discharge disposition: 01-Home or Self Care       Discharge Instructions     Call MD for:  persistant nausea and vomiting   Complete by: As directed    Call MD for:  severe uncontrolled pain   Complete by: As directed    Call MD for:  temperature >100.4   Complete by: As directed    Diet general   Complete by: As directed    Increase activity slowly   Complete by: As directed       Allergies as of 11/19/2024   No Known Allergies      Medication List     TAKE these medications    acetaminophen 500 MG tablet Commonly known as: TYLENOL Take  1,000 mg by mouth every 6 (six) hours as needed for headache or mild pain (pain score 1-3).   Hair/Skin/Nails Tabs Take 1 tablet by mouth in the morning.   ibuprofen 200 MG tablet Commonly known as: ADVIL Take 600 mg by mouth every 6 (six) hours as needed for headache, mild pain (pain score 1-3) or cramping.   megestrol  40 MG tablet Commonly known as: MEGACE  Take 3 tablets (120 mg total) by mouth daily.   OVER THE COUNTER MEDICATION Take 1 Scoop by mouth in the morning. BLOOM GREENS AND SUPERFOODS: for digestion, bloating, and energy         Signed: Vonn VEAR Inch 11/19/2024, 3:03 PM

## 2024-11-20 LAB — BPAM RBC
Blood Product Expiration Date: 202601202359
Blood Product Expiration Date: 202601202359
ISSUE DATE / TIME: 202512270034
ISSUE DATE / TIME: 202512270408
Unit Type and Rh: 5100
Unit Type and Rh: 5100

## 2024-11-20 LAB — TYPE AND SCREEN
ABO/RH(D): O POS
Antibody Screen: NEGATIVE
Unit division: 0
Unit division: 0

## 2024-11-22 ENCOUNTER — Telehealth: Payer: Self-pay | Admitting: Oncology

## 2024-11-22 ENCOUNTER — Other Ambulatory Visit: Payer: Self-pay | Admitting: Obstetrics & Gynecology

## 2024-11-22 DIAGNOSIS — C530 Malignant neoplasm of endocervix: Secondary | ICD-10-CM

## 2024-11-22 NOTE — Telephone Encounter (Signed)
 Called Dr. Genie office to see if Dr. Jayne is able to notify the patient about biopsy results.  Advised Dr. Viktoria would like to schedule an appointment with the patient on Friday, 11/25/24 and we will wait to call the patient until Dr. Jayne is able to talk with her.

## 2024-11-22 NOTE — Telephone Encounter (Signed)
 Left a message with patient's daughter regarding appointment with Dr. Viktoria on 11/25/24 at 11:15. Requested a return call.

## 2024-11-23 ENCOUNTER — Encounter: Payer: Self-pay | Admitting: Gynecologic Oncology

## 2024-11-23 NOTE — Telephone Encounter (Signed)
 Spoke with the patient using [employer] (ID [phone]) Spoke with the patient regarding the referral to GYN oncology. Patient scheduled as new patient with Dr Viktoria on 1/2 at 11:15 am. Patient given an arrival time of 10:45 am.  Explained to the patient the the doctor will perform a pelvic exam at this visit. Patient given the policy that only one visitor allowed and that visitor must be over 16 yrs are allowed in the Cancer Center. Patient given the address/phone number for the clinic and that the center offers free valet service. Patient aware that masks optional.

## 2024-11-23 NOTE — Telephone Encounter (Signed)
 Attempted to call patient with the assistance of [employer].  Left a voicemail requesting a return call to schedule appointment on 11/25/24 at 11:15.

## 2024-11-25 ENCOUNTER — Inpatient Hospital Stay: Payer: Self-pay | Admitting: Gynecologic Oncology

## 2024-11-25 ENCOUNTER — Inpatient Hospital Stay (HOSPITAL_BASED_OUTPATIENT_CLINIC_OR_DEPARTMENT_OTHER): Payer: Self-pay | Admitting: Hematology and Oncology

## 2024-11-25 ENCOUNTER — Encounter: Payer: Self-pay | Admitting: Hematology and Oncology

## 2024-11-25 ENCOUNTER — Encounter: Payer: Self-pay | Admitting: Oncology

## 2024-11-25 ENCOUNTER — Inpatient Hospital Stay: Payer: Self-pay | Attending: Hematology and Oncology

## 2024-11-25 ENCOUNTER — Encounter: Payer: Self-pay | Admitting: Gynecologic Oncology

## 2024-11-25 VITALS — BP 147/79 | HR 105 | Temp 100.5°F | Resp 18 | Ht 60.5 in | Wt 226.2 lb

## 2024-11-25 VITALS — BP 134/75 | HR 80 | Temp 99.0°F | Resp 19 | Ht 60.5 in | Wt 224.0 lb

## 2024-11-25 DIAGNOSIS — D509 Iron deficiency anemia, unspecified: Secondary | ICD-10-CM

## 2024-11-25 DIAGNOSIS — N939 Abnormal uterine and vaginal bleeding, unspecified: Secondary | ICD-10-CM

## 2024-11-25 DIAGNOSIS — N921 Excessive and frequent menstruation with irregular cycle: Secondary | ICD-10-CM

## 2024-11-25 DIAGNOSIS — C539 Malignant neoplasm of cervix uteri, unspecified: Secondary | ICD-10-CM

## 2024-11-25 DIAGNOSIS — D500 Iron deficiency anemia secondary to blood loss (chronic): Secondary | ICD-10-CM

## 2024-11-25 DIAGNOSIS — Z6841 Body Mass Index (BMI) 40.0 and over, adult: Secondary | ICD-10-CM | POA: Insufficient documentation

## 2024-11-25 DIAGNOSIS — E66813 Obesity, class 3: Secondary | ICD-10-CM

## 2024-11-25 DIAGNOSIS — Z79818 Long term (current) use of other agents affecting estrogen receptors and estrogen levels: Secondary | ICD-10-CM | POA: Insufficient documentation

## 2024-11-25 LAB — IRON AND IRON BINDING CAPACITY (CC-WL,HP ONLY)
Iron: 15 ug/dL — ABNORMAL LOW (ref 28–170)
Saturation Ratios: 3 % — ABNORMAL LOW (ref 10.4–31.8)
TIBC: 505 ug/dL — ABNORMAL HIGH (ref 250–450)
UIBC: 490 ug/dL

## 2024-11-25 LAB — RETICULOCYTES
Immature Retic Fract: 39.5 % — ABNORMAL HIGH (ref 2.3–15.9)
RBC.: 4.05 MIL/uL (ref 3.87–5.11)
Retic Count, Absolute: 36 K/uL (ref 19.0–186.0)
Retic Ct Pct: 0.9 % (ref 0.4–3.1)

## 2024-11-25 LAB — CBC WITH DIFFERENTIAL (CANCER CENTER ONLY)
Abs Immature Granulocytes: 0.07 K/uL (ref 0.00–0.07)
Basophils Absolute: 0 K/uL (ref 0.0–0.1)
Basophils Relative: 0 %
Eosinophils Absolute: 0.1 K/uL (ref 0.0–0.5)
Eosinophils Relative: 1 %
HCT: 28.5 % — ABNORMAL LOW (ref 36.0–46.0)
Hemoglobin: 8.2 g/dL — ABNORMAL LOW (ref 12.0–15.0)
Immature Granulocytes: 1 %
Lymphocytes Relative: 20 %
Lymphs Abs: 1.4 K/uL (ref 0.7–4.0)
MCH: 20.2 pg — ABNORMAL LOW (ref 26.0–34.0)
MCHC: 28.8 g/dL — ABNORMAL LOW (ref 30.0–36.0)
MCV: 70.4 fL — ABNORMAL LOW (ref 80.0–100.0)
Monocytes Absolute: 0.4 K/uL (ref 0.1–1.0)
Monocytes Relative: 5 %
Neutro Abs: 5.2 K/uL (ref 1.7–7.7)
Neutrophils Relative %: 73 %
Platelet Count: 301 K/uL (ref 150–400)
RBC: 4.05 MIL/uL (ref 3.87–5.11)
RDW: 24.4 % — ABNORMAL HIGH (ref 11.5–15.5)
WBC Count: 7.2 K/uL (ref 4.0–10.5)
nRBC: 0.3 % — ABNORMAL HIGH (ref 0.0–0.2)

## 2024-11-25 LAB — PROTIME-INR
INR: 1 (ref 0.8–1.2)
Prothrombin Time: 13.7 s (ref 11.4–15.2)

## 2024-11-25 LAB — SEDIMENTATION RATE: Sed Rate: 70 mm/h — ABNORMAL HIGH (ref 0–22)

## 2024-11-25 LAB — FERRITIN: Ferritin: 20 ng/mL (ref 11–307)

## 2024-11-25 LAB — VITAMIN B12: Vitamin B-12: 624 pg/mL (ref 180–914)

## 2024-11-25 LAB — PREGNANCY, URINE: Preg Test, Ur: NEGATIVE

## 2024-11-25 LAB — TSH: TSH: 2.6 u[IU]/mL (ref 0.350–4.500)

## 2024-11-25 LAB — HIV ANTIBODY (ROUTINE TESTING W REFLEX): HIV Screen 4th Generation wRfx: NONREACTIVE

## 2024-11-25 LAB — APTT: aPTT: 28 s (ref 24–36)

## 2024-11-25 NOTE — Patient Instructions (Signed)
 It was very nice to meet you today.  We discussed your recent biopsy which confirms that you have cervical cancer.  We will likely be treating this with a combination of chemotherapy and radiation.  We may discuss surgery as part of your treatment, but we will need to await results from imaging and see your sponsor to treatment.  You are meeting with Dr. Lonn today, who is our medical oncologist.  She gives chemotherapy here but also treats anemia.  We will also get you set up to meet with our radiation oncologist.  The office is working to schedule the 2 types of imaging tests that we discussed today at your visit.  This includes a PET scan, which will look for evidence of cancer spread to other parts in your body and then MRI to look at your cancer locally in and around the cervix.  I will let you know next week once I have your endometrial biopsy back.

## 2024-11-25 NOTE — Progress Notes (Signed)
 Requested PD-L1 testing on accession [id number] with [facility] per Dr. Viktoria.

## 2024-11-25 NOTE — Progress Notes (Signed)
 GYNECOLOGIC ONCOLOGY NEW PATIENT CONSULTATION   Patient Name: Yolanda Braun  Patient Age: 44 y.o. Date of Service: 11/25/2024 Referring Provider: Jayne Vonn DEL, MD [address] 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  Spent some time reviewing with the patient recent workup while she was in the hospital including her cervical biopsy, which confirms a squama cell carcinoma of the cervix.  On my exam today, there is not definitive parametrial involvement nor upper vaginal involvement.  Her tumor is quite large and replaces much of the cervix.  We reviewed the importance of assessing for metastatic disease as we develop a treatment plan.  PET scan was scheduled to evaluate for local and distant metastatic disease.  Plan for pelvic MRI several days after the PET scan.  If PET shows metastatic disease, I will discuss with Dr. Shannon whether he would like to proceed with pelvic MRI for radiation planning.  If the PET scan is negative for metastatic disease, we will proceed with pelvic MRI to help confirm exam findings today regarding parametrial involvement.  My office will reach out to pathology to add PD-L1 testing.  Her cervical tumor was not bleeding at the time of her recent hospital stay and is not bleeding today.  While it certainly is likely contributing to her abnormal bleeding and resulting anemia, she was noted to be anemic previously and given her obesity and risk factors for endometrial hyperplasia and malignancy, I recommended endometrial biopsy today.  This was performed and will be sent to pathology.  I will update her with the results next week.  In the setting of her iron deficiency anemia, patient was offered an appointment with Dr. Lonn today to discuss and address both her anemia but  likely chemotherapy that will be part of her treatment plan.  If imaging confirms cervix-confined disease, we discussed plan for definitive radiation with sensitizing cisplatin and consideration of modified course of brachytherapy with interval hysterectomy depending on her response to treatment.    Given increased risk of cervical cancer in those with HIV, HIV testing was recommended.  A copy of this note was sent to the patient's referring provider.   70 minutes of total time was spent for this patient encounter, including preparation, face-to-face counseling with the patient and coordination of care, and documentation of the encounter.  Comer Dollar, MD  Division of Gynecologic Oncology  Department of Obstetrics and Gynecology  [facility]  ___________________________________________  Chief Complaint: No chief complaint on file.   History of Present Illness:  Yolanda Braun is a 44 y.o. y.o. female who is seen in consultation at the request of Eure, Vonn DEL, MD for an evaluation of cervical cancer.  The patient presented to the emergency department on 12/26 with heavy vaginal bleeding for 4 days with passage of clots larger than normal.  She also endorsed dizziness.  Hemoglobin noted to be 5.4.  On exam, cervix noted to be bulky with posterior lesion consistent with cervical cancer.  Biopsy performed.  Cervical lesion was not bleeding and patient received IV Premarin  and was started on Megace .  She also received 2 units of packed red blood cells.  Hemoglobin on day of discharge was 7.6.  Pelvic ultrasound performed on 12/26 showed a uterus measuring 9.2 x 4.6 x 6.1 cm.  There is  a 3.5 cm focal hypodensity within the cervical region which measures up to 5 cm.  Nabothian cyst also seen.  Endometrial lining measures 9 mm.  Right ovary measures up to 3.9 cm and has a cystic area measuring 2.3 cm.  Left ovary measures up to 5.8 cm with a well-circumscribed  homogeneous isoechoic structure measuring 5.2 x 3.2 x 3.5 cm without significant internal vascularity.  No free fluid.  Cervical biopsy shows invasive and in situ moderately differentiated squamous cell carcinoma.  Angiolymphatic invasion not identified.  Cycles are typically monthly lasting 7 days.  Today, the patient presents by herself.  She notes overall doing well.  She has been taking Megace  since hospital discharge.  Had some bleeding yesterday but notes very little today.  She describes history of regular menses that occur about every month and last 7 days.  She typically has 2 days of heavier bleeding and then 5 days of moderate to light bleeding.  When she presented to the emergency department, she endorses 4 days of intermittent very heavy bleeding.  At times, bleeding through a pad and her clothing when she stood up.  Ultimately transition to diaper use.  Also was passing clots that were orange size.  She denies any intermenstrual bleeding.  Denies any pain or cramping.  Had some dizziness when she was having heavy bleeding last week but denies any dizziness previously or since.  Denies any shortness of breath.  Has not received a blood transfusion before her recent hospital stay.  She endorses a regular appetite without nausea or emesis.  Denies any changes to bowel or bladder function.  Has not been sexually active since her hospital stay.  PAST MEDICAL HISTORY:  History reviewed. No pertinent past medical history.   PAST SURGICAL HISTORY:  History reviewed. No pertinent surgical history.  OB/GYN HISTORY:  OB History  Gravida Para Term Preterm AB Living  4 4      SAB IAB Ectopic Multiple Live Births          # Outcome Date GA Lbr Len/2nd Weight Sex Type Anes PTL Lv  4 Para           3 Para           2 Para           1 Para             No LMP recorded.  Age at menarche: 18  Hx of STDs: denies Last pap: approximately 15 years ago -notes last Pap test was at the time  of the birth of her early youngest child.  Denies any history of abnormal Pap smears. History of abnormal pap smears: denies  SCREENING STUDIES:  Last mammogram: n/a  Last colonoscopy: n/a  MEDICATIONS: Outpatient Encounter Medications as of 11/25/2024  Medication Sig   acetaminophen (TYLENOL) 500 MG tablet Take 1,000 mg by mouth every 6 (six) hours as needed for headache or mild pain (pain score 1-3).   ibuprofen (ADVIL) 200 MG tablet Take 600 mg by mouth every 6 (six) hours as needed for headache, mild pain (pain score 1-3) or cramping.   megestrol  (MEGACE ) 40 MG tablet Take 3 tablets (120 mg total) by mouth daily.   OVER THE COUNTER MEDICATION Take 1 Scoop by mouth in the morning. BLOOM GREENS AND SUPERFOODS: for digestion, bloating, and energy   [DISCONTINUED] Multiple Vitamins-Minerals (HAIR/SKIN/NAILS) TABS Take 1 tablet by mouth in the morning.   No facility-administered encounter medications on file as of  11/25/2024.    ALLERGIES:  Allergies[1]   FAMILY HISTORY:  Family History  Problem Relation Age of Onset   Diabetes Mother    Heart disease Mother    Colon cancer Neg Hx    Breast cancer Neg Hx    Ovarian cancer Neg Hx    Endometrial cancer Neg Hx    Pancreatic cancer Neg Hx    Prostate cancer Neg Hx      SOCIAL HISTORY:  Social Connections: Not on file    REVIEW OF SYSTEMS:  + vaginal bleeding Denies appetite changes, fevers, chills, fatigue, unexplained weight changes. Denies hearing loss, neck lumps or masses, mouth sores, ringing in ears or voice changes. Denies cough or wheezing.  Denies shortness of breath. Denies chest pain or palpitations. Denies leg swelling. Denies abdominal distention, pain, blood in stools, constipation, diarrhea, nausea, vomiting, or early satiety. Denies pain with intercourse, dysuria, frequency, hematuria or incontinence. Denies hot flashes, pelvic pain.   Denies joint pain, back pain or muscle pain/cramps. Denies itching, rash, or  wounds. Denies dizziness, headaches, numbness or seizures. Denies swollen lymph nodes or glands, denies easy bruising or bleeding. Denies anxiety, depression, confusion, or decreased concentration.  Physical Exam:  Vital Signs for this encounter:  Blood pressure 134/75, pulse 80, temperature 99 F (37.2 C), temperature source Oral, resp. rate 19, height 5' 0.5 (1.537 m), weight 224 lb (101.6 kg), SpO2 100%. Body mass index is 43.03 kg/m. General: Alert, oriented, no acute distress.  HEENT: Normocephalic, atraumatic. Sclera anicteric.  Chest: Clear to auscultation bilaterally. No wheezes, rhonchi, or rales. Cardiovascular: Regular rate and rhythm, no murmurs, rubs, or gallops.  Abdomen: Obese. Normoactive bowel sounds. Soft, nondistended, nontender to palpation. No masses or hepatosplenomegaly appreciated. No palpable fluid wave.  Extremities: Grossly normal range of motion. Warm, well perfused. No edema bilaterally.  Skin: No rashes or lesions.  Lymphatics: No cervical, supraclavicular, or inguinal adenopathy.  GU:  Normal external female genitalia. No lesions. No discharge or bleeding.             Bladder/urethra:  No lesions or masses, well supported bladder             Vagina: Well-rugated, no lesions.             Cervix: Cervix markedly enlarged with the entire posterior face replaced with tumor measuring approximately 5-6 cm.  No active bleeding noted.  No obvious vaginal involvement visually.  On bimanual exam.  The cervix is firm and barrel-shaped measuring approximately 5-6 cm with no upper vaginal involvement palpated.             Uterus: Mobile, no discrete parametrial involvement or nodularity.             Adnexa: No masses appreciated.  Rectal: Confirms above findings.  Endometrial biopsy procedure Preoperative diagnosis: AUB Postoperative diagnosis: Same as above Physician: Viktoria MD Estimated blood loss: Minimal Specimens: Endometrial biopsy Procedure: After the  procedure was discussed with the patient including risks and benefits, she gave verbal consent.  She was then placed in dorsolithotomy position and a speculum was placed in the vagina.  Once the cervix was well visualized it was cleansed with Betadine x3.  An endometrial Pipelle was then passed to a depth of just under 9 cm.  1 pass was performed with scant tissue obtained.  This was placed in formalin.  Overall the patient tolerated the procedure well.  All instruments were removed from the vagina.  LABORATORY AND RADIOLOGIC DATA:  Outside medical records were reviewed to synthesize the above history, along with the history and physical obtained during the visit.   Lab Results  Component Value Date   WBC 6.8 11/19/2024   HGB 7.6 (L) 11/19/2024   HCT 26.1 (L) 11/19/2024   PLT 280 11/19/2024   GLUCOSE 102 (H) 11/18/2024   ALT 95 (H) 11/18/2024   AST 74 (H) 11/18/2024   NA 136 11/18/2024   K 3.7 11/18/2024   CL 103 11/18/2024   CREATININE 0.42 (L) 11/18/2024   BUN 9 11/18/2024   CO2 24 11/18/2024       [1] No Known Allergies

## 2024-11-27 ENCOUNTER — Encounter: Payer: Self-pay | Admitting: Hematology and Oncology

## 2024-11-27 DIAGNOSIS — C539 Malignant neoplasm of cervix uteri, unspecified: Secondary | ICD-10-CM | POA: Insufficient documentation
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
## 2024-11-27 NOTE — Assessment & Plan Note (Signed)
 She is newly diagnosed with cervical cancer, further staging and workup in progress According to documentation by Dr. Viktoria, it does not appear that she is an upfront surgical candidate I briefly discussed with her that she will likely need concurrent chemoradiation therapy however imaging studies are very helpful in helping us  to decide treatment modalities I will see her back once all imaging studies are done for further discussion about treatment

## 2024-11-27 NOTE — Assessment & Plan Note (Signed)
 She has severe iron deficiency anemia likely due to chronic bleeding Repeat CBC today show hemoglobin greater than 8 She does not need blood transfusion support I recommend IV iron infusion and she agreed to proceed

## 2024-11-28 ENCOUNTER — Ambulatory Visit: Payer: Self-pay | Admitting: Gynecologic Oncology

## 2024-11-28 ENCOUNTER — Telehealth: Payer: Self-pay | Admitting: Oncology

## 2024-11-28 LAB — SURGICAL PATHOLOGY

## 2024-11-28 NOTE — Telephone Encounter (Signed)
-----   Message from Comer Dollar, MD sent at 11/28/2024 12:37 PM EST ----- Yolanda Braun, could you please call this patient and let her know that the endometrial biopsy from her visit last week is benign? This help to confirm that we are only dealing with cancer of her cervix  and that has likely contributed to her abnormal/heavy bleeding.

## 2024-11-28 NOTE — Telephone Encounter (Signed)
 Left a message with the assistance of [employer] for appointments on 12/09/24.

## 2024-11-28 NOTE — Telephone Encounter (Signed)
 Spoke with Yolanda Braun through [place] [employer] [phone] and relayed message from Yolanda Braun that Yolanda Braun endometrial biopsy (lining of the uterus) from her visit last week is benign (non cancerous). This helps to confirm that we are only dealing with cancer of the cervix and that has likely contributed to Yolanda Braun abnormal/heavy bleeding.  Pt verbalized understanding.  Patient was also reminded of her Pet Scan on 1/8 at [facility] and MRI that is scheduled on 1/14. Pt confirmed these appointments and thanked the office for the reminder.   Patient was also informed that the nurse navigator called about her appointments that are scheduled at the [facility] on Friday, 1/16.The first is for a lab appointment at 1:30 pm, to arrive by 1:15 pm for check in and then she will see Yolanda Braun (medical oncologist) at 2 pm and at 4 pm is her education for chemotherapy. Pt verbalized understanding and thanked the office for calling.

## 2024-11-29 ENCOUNTER — Other Ambulatory Visit: Payer: Self-pay | Admitting: Hematology and Oncology

## 2024-11-30 ENCOUNTER — Encounter (HOSPITAL_COMMUNITY): Payer: Self-pay

## 2024-11-30 ENCOUNTER — Telehealth: Payer: Self-pay | Admitting: Radiation Oncology

## 2024-11-30 ENCOUNTER — Telehealth: Payer: Self-pay | Admitting: *Deleted

## 2024-11-30 NOTE — Telephone Encounter (Signed)
 Spoke with the patient again and she will call the insurance company. Patient aware the PET scan was moved to 1/15 at 12:30 pm

## 2024-11-30 NOTE — Telephone Encounter (Signed)
 Spoke with the patient using Education Officer, [employer] (ID # [phone]) regarding calling her insurance and updating her DOB. Patient stated that her and her husband called the insurance company yesterday. Asked if the patient and call to make sure it was updated and the our office will call her abck once we know if the scan is approved.

## 2024-11-30 NOTE — Telephone Encounter (Signed)
LVM to schedule consult with Dr. Kinard °

## 2024-12-01 ENCOUNTER — Telehealth: Payer: Self-pay | Admitting: Radiation Oncology

## 2024-12-01 ENCOUNTER — Other Ambulatory Visit (HOSPITAL_COMMUNITY): Payer: Self-pay

## 2024-12-01 NOTE — Telephone Encounter (Signed)
LVM to schedule consult with Dr. Kinard °

## 2024-12-05 ENCOUNTER — Other Ambulatory Visit: Payer: Self-pay | Admitting: Oncology

## 2024-12-05 ENCOUNTER — Telehealth: Payer: Self-pay | Admitting: Radiation Oncology

## 2024-12-05 LAB — SURGICAL PATHOLOGY

## 2024-12-05 NOTE — Progress Notes (Signed)
 Gynecologic Oncology Multi-Disciplinary Disposition Conference Note  Date of the Conference: 12/05/2024  Patient Name: Yolanda Braun  Referring Provider: Dr. Jayne Primary GYN Oncologist: Dr. 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 Disposition if lymph nodes are positive on imaging is to primary radiation/brachytherapy and sensitizing cisplatin.  If lymph nodes are negative, consideration of a modified course of brachytherapy with interval hysterectomy.   This Multidisciplinary conference took place involving physicians from Gynecologic Oncology, Medical Oncology, Radiation Oncology, Pathology, Radiology along with the Gynecologic Oncology Nurse Practitioner and Gynecologic Oncology Nurse Navigator.  Comprehensive assessment of the patient's malignancy, staging, need for surgery, chemotherapy, radiation therapy, and need for further testing were reviewed. Supportive measures, both inpatient and following discharge were also discussed. The recommended plan of care is documented. Greater than 35 minutes were spent correlating and coordinating this patient's care.

## 2024-12-05 NOTE — Telephone Encounter (Signed)
 Thanks

## 2024-12-05 NOTE — Telephone Encounter (Signed)
 Called pt to schedule consult with Dr. Shannon. Pt requested delay due to other appts. Pt agreeable to consult 1/21 @ 2:30pm.

## 2024-12-06 ENCOUNTER — Inpatient Hospital Stay: Payer: Self-pay

## 2024-12-06 NOTE — Telephone Encounter (Signed)
 Spoke with the patient with [employer] (ID # [id number]). We called the insurance company and was on the phone with the patient, our interpreter and the insurance company for 40 minutes. Unable to change DOB. Patient to call back with her husband to the insurance company later today or tomorrow.   Patient aware that if she keeps appts and goes to them that insurance may not cover the cost

## 2024-12-07 ENCOUNTER — Ambulatory Visit (HOSPITAL_COMMUNITY)
Admission: RE | Admit: 2024-12-07 | Discharge: 2024-12-07 | Disposition: A | Discharge status: Home / Self Care | Payer: Self-pay | Source: Ambulatory Visit | Attending: Gynecologic Oncology | Admitting: Gynecologic Oncology

## 2024-12-07 DIAGNOSIS — C539 Malignant neoplasm of cervix uteri, unspecified: Secondary | ICD-10-CM | POA: Insufficient documentation

## 2024-12-07 MED ORDER — GADOBUTROL 1 MMOL/ML IV SOLN
10.0000 mL | Freq: Once | INTRAVENOUS | Status: AC | PRN
  Administered 2024-12-07: 10 mL via INTRAVENOUS

## 2024-12-08 ENCOUNTER — Telehealth: Payer: Self-pay | Admitting: Oncology

## 2024-12-08 ENCOUNTER — Encounter (HOSPITAL_COMMUNITY): Admission: RE | Admit: 2024-12-08 | Payer: Self-pay

## 2024-12-08 ENCOUNTER — Encounter: Payer: Self-pay | Admitting: Hematology and Oncology

## 2024-12-08 ENCOUNTER — Other Ambulatory Visit: Payer: Self-pay | Admitting: Hematology and Oncology

## 2024-12-08 NOTE — Telephone Encounter (Signed)
 Called Yolanda Braun with the assistance of [employer] and let her know that since she has canceled her PET scan today, that we will need to cancel her appointments for labs, see Dr. Lonn and education tomorrow.  Yolanda Braun said she had to cancel because she had a meeting for her passport expiring. She asked to reschedule the PET scan.  Called her back with the interpreter and advised her of PET scan scheduled for 12/20/24 with arrival at 3:30 at [facility].  Gave her instructions for NPO 6 hours prior.  Also let her know that I will call her back to reschedule the appointments with Dr. Lonn once she has the scan.  She verbalized understanding and agreement.

## 2024-12-09 ENCOUNTER — Inpatient Hospital Stay: Payer: Self-pay

## 2024-12-09 ENCOUNTER — Inpatient Hospital Stay: Payer: Self-pay | Admitting: Hematology and Oncology

## 2024-12-12 NOTE — Progress Notes (Incomplete)
 " Radiation Oncology         [phone] ________________________________  Initial Outpatient Consultation  Name: Yolanda Braun MRN: [id number]  Date: 12/14/2024  DOB: November 24, 1981  RR:[id number], No Pcp Per  Viktoria Comer SAUNDERS, MD   REFERRING PHYSICIAN: Viktoria Comer SAUNDERS, MD  DIAGNOSIS: There were no encounter diagnoses.  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 female who is accompanied by ***. she is seen as a courtesy of Dr. Viktoria for an opinion concerning radiation therapy as part of management for her recently diagnosed cervical cancer.  The patient presented to the ED on 11/18/24 with complains of heavy vaginal bleeding. She underwent a pelvic ultrasound to evaluate her symptoms. Exam showed an indeterminate well-circumscribed homogeneous avascular left ovarian lesion measuring 5.2 x 3.2 x 3.5 cm along with an indeterminate cervical region lesion measuring up to 5.0 cm.   In light of findings, she then underwent a cervical biopsy with pathology showing invasive and in situ moderately differentiated squamous cell carcinoma without angiolymphatic invasion. Immunohistochemistry for PD-L1 22C3 (Keytruda) is positive with a score of 90.   She was then referred to Dr. Viktoria on 11/25/24 to discuss further treatment plan. Dr. Viktoria and Dr. Lonn will determine further treatment once MRI and PET scan are complete. She also underwent an endometrium biopsy at that time with pathology being negative for endometrial intraepithelial neoplasia (EIN) and malignancy.    Pelvic MRI performed on 12/08/23 showed a fairly well-circumscribed T2 hyperintense lesion arising from the posterior wall of the cervix reaching up to the external os. The overlying parametrium is intact. No extension into the vagina, pelvic sidewall, lower uterine segment or urinary bladder. Scan also showed several subcentimeter sized left internal iliac  group of lymph nodes, with largest measuring up to 8 x 10 mm.   PET scan is scheduled for 12/20/24.   PREVIOUS RADIATION THERAPY: No  PAST MEDICAL HISTORY:  Past Medical History:  Diagnosis Date   Anemia    Cervical cancer (HCC)     PAST SURGICAL HISTORY:No past surgical history on file.  FAMILY HISTORY:  Family History  Problem Relation Age of Onset   Diabetes Mother    Heart disease Mother    Hyperlipidemia Father    Colon cancer Neg Hx    Breast cancer Neg Hx    Ovarian cancer Neg Hx    Endometrial cancer Neg Hx    Pancreatic cancer Neg Hx    Prostate cancer Neg Hx     SOCIAL HISTORY: Social History[1]  ALLERGIES: Allergies[2]  MEDICATIONS:  Current Outpatient Medications  Medication Sig Dispense Refill   acetaminophen (TYLENOL) 500 MG tablet Take 1,000 mg by mouth every 6 (six) hours as needed for headache or mild pain (pain score 1-3).     ibuprofen (ADVIL) 200 MG tablet Take 600 mg by mouth every 6 (six) hours as needed for headache, mild pain (pain score 1-3) or cramping.     megestrol  (MEGACE ) 40 MG tablet Take 3 tablets (120 mg total) by mouth daily. 90 tablet 1   OVER THE COUNTER MEDICATION Take 1 Scoop by mouth in the morning. BLOOM GREENS AND SUPERFOODS: for digestion, bloating, and energy     No current facility-administered medications for this visit.    REVIEW OF SYSTEMS:  A 10+ POINT REVIEW OF SYSTEMS WAS OBTAINED including neurology, dermatology, psychiatry, cardiac, respiratory, lymph, extremities, GI, GU, musculoskeletal, constitutional, reproductive, HEENT. ***   PHYSICAL  EXAM:  vitals were not taken for this visit.   General: Alert and oriented, in no acute distress HEENT: Head is normocephalic. Extraocular movements are intact. Oropharynx is clear. Neck: Neck is supple, no palpable cervical or supraclavicular lymphadenopathy. Heart: Regular in rate and rhythm with no murmurs, rubs, or gallops. Chest: Clear to auscultation bilaterally, with no  rhonchi, wheezes, or rales. Abdomen: Soft, nontender, nondistended, with no rigidity or guarding. Extremities: No cyanosis or edema. Lymphatics: see Neck Exam Skin: No concerning lesions. Musculoskeletal: symmetric strength and muscle tone throughout. Neurologic: Cranial nerves II through XII are grossly intact. No obvious focalities. Speech is fluent. Coordination is intact. Psychiatric: Judgment and insight are intact. Affect is appropriate. ***  ECOG = ***  0 - Asymptomatic (Fully active, able to carry on all predisease activities without restriction)  1 - Symptomatic but completely ambulatory (Restricted in physically strenuous activity but ambulatory and able to carry out work of a light or sedentary nature. For example, light housework, office work)  2 - Symptomatic, <50% in bed during the day (Ambulatory and capable of all self care but unable to carry out any work activities. Up and about more than 50% of waking hours)  3 - Symptomatic, >50% in bed, but not bedbound (Capable of only limited self-care, confined to bed or chair 50% or more of waking hours)  4 - Bedbound (Completely disabled. Cannot carry on any self-care. Totally confined to bed or chair)  5 - Death   Raylene MM, Creech RH, Tormey DC, et al. [phone]). Toxicity and response criteria of the [facility]. Am. DOROTHA Bridges. Oncol. 5 (6): 649-55  LABORATORY DATA:  Lab Results  Component Value Date   WBC 7.2 11/25/2024   HGB 8.2 (L) 11/25/2024   HCT 28.5 (L) 11/25/2024   MCV 70.4 (L) 11/25/2024   PLT 301 11/25/2024   NEUTROABS 5.2 11/25/2024   Lab Results  Component Value Date   NA 136 11/18/2024   K 3.7 11/18/2024   CL 103 11/18/2024   CO2 24 11/18/2024   GLUCOSE 102 (H) 11/18/2024   BUN 9 11/18/2024   CREATININE 0.42 (L) 11/18/2024   CALCIUM 8.7 (L) 11/18/2024      RADIOGRAPHY: MR Pelvis W Wo Contrast Result Date: 12/07/2024 CLINICAL DATA:  Cervical cancer, staging. EXAM: MRI PELVIS  WITHOUT AND WITH CONTRAST TECHNIQUE: Multiplanar multisequence MR imaging of the pelvis was performed both before and after administration of intravenous contrast. CONTRAST:  10mL GADAVIST  GADOBUTROL  1 MMOL/ML IV SOLN COMPARISON:  Pelvic ultrasound from 11/19/2024. FINDINGS: Urinary Tract: Limited evaluation of bilateral kidneys on coronal T2 weighted images. No hydroureteronephrosis. No suspicious renal mass. Urinary bladder is partially distended and appears within normal limits. Bowel: No disproportionate dilation of small or large bowel loops. Unremarkable appendix. Vascular/Lymphatic: There are several subcentimeter sized left internal iliac group of lymph nodes, with largest measuring up to 8 x 10 mm (series 4, image 21). These are nonspecific. No significant vascular abnormality seen. Reproductive: Slightly bulky anteverted uterus measuring up to 5.1 x 5.6 cm orthogonally on sagittal plane. No suspicious uterine lesion. There is diffusely thickened junctional zone measuring up to 1.6-1.8 cm. There is a sub 5 mm subendometrial cyst in the right fundal region. The endometrium is thickened measuring up to 11-12 mm. No focal lesion. There is a fairly well-circumscribed T2 hyperintense 2.7 x 3.1 cm lesion arising from the posterior wall of the cervix. The lesion reaches up to the external os. The overlying parametrium is intact.  There are multiple subcentimeter sized nabothian cysts in the anterior cervix. There are several T1 hyperintense and T2 mildly hyperintense structure within the right ovary with largest measuring up to 8 x 13 mm. No enhancement on the postcontrast images. These are favored to represent endometriomas. Adjacent to the right ovary, there is a well-circumscribed 3.8 x 5.3 cm, T1 hyperintense and T2 hypointense nonenhancing structure, which is exophytically arises from the left ovary. This is favored to represent endometrioma in the left ovary. Vagina is within normal limits.  No suspicious  mass. Other:  None. Musculoskeletal: No suspicious bone lesions identified. IMPRESSION: 1. There is a fairly well-circumscribed T2 hyperintense lesion arising from the posterior wall of the cervix reaching up to the external os. The overlying parametrium is intact. No extension into the vagina, pelvic sidewall, lower uterine segment or urinary bladder. This is compatible with provided history of cervical carcinoma. 2. There are several subcentimeter sized left internal iliac group of lymph nodes, with largest measuring up to 8 x 10 mm. These are nonspecific. 3. There is a 3.8 x 5.3 cm endometrioma in the left ovary. There are several endometriomas in the right ovary with largest measuring up to 8 x 13 mm. 4. There is diffuse thickening of the junctional zone, compatible with diffuse uterine adenomyosis. Electronically Signed   By: Ree Molt M.D.   On: 12/07/2024 15:25   US  PELVIC COMPLETE WITH TRANSVAGINAL Result Date: 11/19/2024 EXAM: US  Pelvis, Complete Transvaginal and Transabdominal without Doppler TECHNIQUE: Transabdominal and transvaginal pelvic duplex ultrasound using B-mode/gray scaled imaging without Doppler spectral analysis and color flow was obtained. COMPARISON: None provided CLINICAL HISTORY: Vaginal bleeding, anemia. FINDINGS: UTERUS: Uterus measures 9.2 x 4.8 x 6.1 cm. Uterus demonstrates normal myometrial echotexture. There is a 3.5 cm focal hypodensity area within the cervical region measuring 3.3 x 2.2 x 5.0 cm. Additionally, there are nabothian cysts within the cervix. ENDOMETRIAL STRIPE: Endometrium measures 0.9 cm. Endometrial stripe is within normal limits. RIGHT OVARY: Right ovary measures 3.9 x 2.8 x 2.6 cm. There are cystic areas in the right ovary measuring up to 2.3 cm. LEFT OVARY: Left ovary measures 5.8 x 3.9 x 4.4 cm. There is a well circumscribed homogeneous isoechoic structure within the left ovary measuring 5.2 x 3.2 x 3.5 cm without significant internal vascularity. FREE  FLUID: No free fluid. IMPRESSION: 1. Indeterminate well-circumscribed homogeneous avascular left ovarian lesion measuring 5.2 x 3.2 x 3.5 cm, with pelvic MRI with IV contrast recommended for further characterization. 2. Indeterminate cervical region lesion measuring up to 5.0 cm, with gynecology evaluation and/or pelvic MRI with IV contrast recommended for further characterization. 3. Small right ovarian cysts measuring up to 2.3 cm, compatible with follicles and requiring no follow-up imaging. 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  We discussed the natural history of *** and general treatment, highlighting the role of radiotherapy in the management.  We discussed the available radiation techniques, and focused on the details of logistics and delivery.  We reviewed the anticipated acute and late sequelae associated with radiation in this setting.  The patient was encouraged to ask questions that I answered to the best of my ability. *** A patient consent form was discussed and signed.  We retained a copy for our records.  The patient would like to proceed with radiation and will be scheduled for CT simulation.  PLAN: ***    *** minutes of total time was spent for this patient encounter, including preparation, face-to-face counseling with the patient and coordination of care, physical exam, and documentation of the encounter.   ------------------------------------------------  Lynwood CHARM Nasuti, PhD, MD  This document serves as a record of services personally performed by Lynwood Nasuti, MD. It was created on his behalf by Reymundo Cartwright, a trained medical scribe. The creation of this record is based on the scribe's personal observations and the provider's statements to them. This document has been  checked and approved by the attending provider.     [1]  Social History Tobacco Use   Smoking status: Never   Smokeless tobacco: Never  Vaping Use   Vaping status: Former  Substance Use Topics   Alcohol use: Yes    Alcohol/week: 6.0 standard drinks of alcohol    Types: 6 Cans of beer per week   Drug use: Never  [2] No Known Allergies  "

## 2024-12-13 NOTE — Progress Notes (Incomplete)
 GYN Location of Tumor / Histology: {Blank single:19197::Endometrial,Cervical,Uterine}  Yolanda Braun presented with symptoms of: {symptoms; gyn cyclic:13153::bleeding}  Biopsies revealed:    Past/Anticipated interventions by Gyn/Onc surgery, if any:  Dr. Viktoria   Past/Anticipated interventions by medical oncology, if any:  Dr. Lonn   Weight changes, if any: {:18581}  Bowel/Bladder complaints, if any: {yes no:314532}, {Blank single:19197::diarrhea,constipation,urinary frequency,burning,trouble emptying bladder, }  Nausea/Vomiting, if any: {:18581}  Pain issues, if any:  {:18581}  SAFETY ISSUES: Prior radiation? {:18581} Pacemaker/ICD? {:18581} Possible current pregnancy? {:18581} Is the patient on methotrexate? {:18581}  Current Complaints / other details:  ***

## 2024-12-14 ENCOUNTER — Ambulatory Visit
Admission: RE | Admit: 2024-12-14 | Discharge: 2024-12-14 | Disposition: A | Discharge status: Home / Self Care | Payer: Self-pay | Source: Ambulatory Visit | Attending: Radiation Oncology | Admitting: Radiation Oncology

## 2024-12-14 ENCOUNTER — Ambulatory Visit: Payer: Self-pay

## 2024-12-14 NOTE — Progress Notes (Signed)
 GYN Location of Tumor / Histology: Cervical  Heddy Chambers presented with symptoms of: {symptoms; gyn cyclic:13153::bleeding}  Biopsies revealed:    Past/Anticipated interventions by Gyn/Onc surgery, if any:  Dr. Viktoria   Past/Anticipated interventions by medical oncology, if any:  Dr. Lonn   Weight changes, if any: {:18581}  Bowel/Bladder complaints, if any: {yes no:314532}, {Blank single:19197::diarrhea,constipation,urinary frequency,burning,trouble emptying bladder, }  Nausea/Vomiting, if any: {:18581}  Pain issues, if any:  {:18581}  SAFETY ISSUES: Prior radiation? {:18581} Pacemaker/ICD? {:18581} Possible current pregnancy? {:18581} Is the patient on methotrexate? {:18581}  Current Complaints / other details:  ***

## 2024-12-14 NOTE — Progress Notes (Shared)
 " Radiation Oncology         [phone] ________________________________  Initial Outpatient Consultation  Name: Yolanda Braun MRN: [id number]  Date: 12/15/2024  DOB: [birth year]  RR:[id number], No Pcp Per  Viktoria Comer SAUNDERS, MD   REFERRING PHYSICIAN: Viktoria Comer SAUNDERS, MD  Patient requires the use of an interpreter.  Interpreter present today.  DIAGNOSIS: The encounter diagnosis was Malignant neoplasm of cervix, unspecified site [facility]).   At least Stage IB3 cervical cancer, pending full staging work-up  HISTORY OF PRESENT ILLNESS: Yolanda Braun is a 44 y.o. female who is being seen at the request of Dr. Viktoria for newly diagnosed cervical cancer.  The patient originally presented to the emergency department on 11/18/2024 with a 4-day history of heavy vaginal bleeding with passage of clots larger than normal.  She was subsequently admitted. Hemoglobin at that time was noted to be 5.4.  On physical exam the cervix was noted to be bulky with a posterior lesion consistent with cervical cancer.  Pelvic ultrasound showed a uterus measuring 9.2 cm;  A 3.5 cm focal hypodensity within the cervical region; endometrial lining measuring 9 mm; a right ovary measuring up to 3.9 cm; a left ovary measuring up to 5.8 cm; and no free fluid. 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  The cervical lesion was not bleeding at that time and the patient received IV Premarin  and was started on Megace .  She also received 2 units of packed red blood cells.  Her hemoglobin on the day of discharge was 7.6.  The patient met with Dr. Viktoria on 11/25/2024 to discuss her current workup.  On exam, she noted no definitive parametrial involvement nor upper vaginal involvement.  Her tumor was noted to be quite large and replacing much of the cervix. Endometrial biopsy performed that day was negative  for endometrial intraepithelial neoplasia and malignancy. Dr. Viktoria did not think the patient was a upfront surgical candidate. She recommended planning for definitive radiation with sensitizing cisplatin and consideration of brachytherapy with interval hysterectomy, depending on her response to treatment.   She met with Dr. Lonn on 11/25/2024 for her cervical cancer as well as severe iron deficiency anemia. Given that she had not finished her workup, Dr. Lonn recommended further discussion after imaging studies had been done. She attributed the anemia to likely be due to chronic bleeding and recommended IV iron infusion.   MRI of the pelvis on 12/07/2024 demonstrated: a lesion arising from the posterior wall of the cervix reaching up to the external os, with the overlying parametrium to be intact; no extension in the vagina, pelvic sidewall, lower uterine segment or urinary bladder several subcentimeter sized left internal iliac group of lymph nodes, with the largest measuring up to 10 mm were seen; a 5.3 cm in the left ovary with several endometriomas in the right ovary also seen; diffuse thickening of the junctional zone, compatible with diffuse uterine adenomyosis.   PET is scheduled for 12/20/2024.   Today, the patient denies any vaginal bleeding since her pelvic exam with Dr. Viktoria. She denies any abdominal or pelvic pain pain. She denies any nausea, constipation, or diarrhea.    PREVIOUS RADIATION THERAPY: No  PAST MEDICAL HISTORY:  has a past medical history of Anemia and Cervical cancer (HCC).    PAST SURGICAL HISTORY:History reviewed. No pertinent surgical history.  FAMILY HISTORY: family history includes Diabetes in her mother; Heart disease  in her mother; Hyperlipidemia in her father.  SOCIAL HISTORY:  reports that she has never smoked. She has never used smokeless tobacco. She reports current alcohol use of about 6.0 standard drinks of alcohol per week. She reports that she  does not use drugs.  ALLERGIES: Patient has no known allergies.  MEDICATIONS:  Current Outpatient Medications  Medication Sig Dispense Refill   acetaminophen (TYLENOL) 500 MG tablet Take 1,000 mg by mouth every 6 (six) hours as needed for headache or mild pain (pain score 1-3).     ibuprofen (ADVIL) 200 MG tablet Take 600 mg by mouth every 6 (six) hours as needed for headache, mild pain (pain score 1-3) or cramping.     megestrol  (MEGACE ) 40 MG tablet Take 3 tablets (120 mg total) by mouth daily. 90 tablet 1   OVER THE COUNTER MEDICATION Take 1 Scoop by mouth in the morning. BLOOM GREENS AND SUPERFOODS: for digestion, bloating, and energy     No current facility-administered medications for this encounter.    REVIEW OF SYSTEMS: Notable for that above.    PHYSICAL EXAM:  height is 5' 0.5 (1.537 m) and weight is 220 lb (99.8 kg). Her temperature is 97.7 F (36.5 C). Her blood pressure is 144/93 (abnormal) and her pulse is 110 (abnormal). Her respiration is 18 and oxygen saturation is 100%.   General: Alert and oriented, in no acute distress HEENT: Head is normocephalic. Extraocular movements are intact. Oropharynx is clear. Neck: Neck is supple, no palpable cervical or supraclavicular lymphadenopathy. Heart: Regular in rate and rhythm with no murmurs, rubs, or gallops. Chest: Clear to auscultation bilaterally, with no rhonchi, wheezes, or rales. Abdomen: Soft, nontender, nondistended, with no rigidity or guarding. Extremities: No cyanosis or edema. Lymphatics: see Neck Exam Skin: No concerning lesions. Musculoskeletal: symmetric strength and muscle tone throughout. Neurologic: Cranial nerves II through XII are grossly intact. No obvious focalities. Speech is fluent. Coordination is intact. Psychiatric: Judgment and insight are intact. Affect is appropriate.   ECOG = 1  0 - Asymptomatic (Fully active, able to carry on all predisease activities without restriction)  1 - Symptomatic  but completely ambulatory (Restricted in physically strenuous activity but ambulatory and able to carry out work of a light or sedentary nature. For example, light housework, office work)  2 - Symptomatic, <50% in bed during the day (Ambulatory and capable of all self care but unable to carry out any work activities. Up and about more than 50% of waking hours)  3 - Symptomatic, >50% in bed, but not bedbound (Capable of only limited self-care, confined to bed or chair 50% or more of waking hours)  4 - Bedbound (Completely disabled. Cannot carry on any self-care. Totally confined to bed or chair)  5 - Death   Raylene MM, Creech RH, Tormey DC, et al. [phone]). Toxicity and response criteria of the [facility]. Am. DOROTHA Bridges. Oncol. 5 (6): 649-55  LABORATORY DATA:  Lab Results  Component Value Date   WBC 7.2 11/25/2024   HGB 8.2 (L) 11/25/2024   HCT 28.5 (L) 11/25/2024   MCV 70.4 (L) 11/25/2024   PLT 301 11/25/2024   NEUTROABS 5.2 11/25/2024   Lab Results  Component Value Date   NA 136 11/18/2024   K 3.7 11/18/2024   CL 103 11/18/2024   CO2 24 11/18/2024   GLUCOSE 102 (H) 11/18/2024   BUN 9 11/18/2024   CREATININE 0.42 (L) 11/18/2024   CALCIUM 8.7 (L) 11/18/2024  RADIOGRAPHY: MR Pelvis W Wo Contrast Result Date: 12/07/2024 CLINICAL DATA:  Cervical cancer, staging. EXAM: MRI PELVIS WITHOUT AND WITH CONTRAST TECHNIQUE: Multiplanar multisequence MR imaging of the pelvis was performed both before and after administration of intravenous contrast. CONTRAST:  10mL GADAVIST  GADOBUTROL  1 MMOL/ML IV SOLN COMPARISON:  Pelvic ultrasound from 11/19/2024. FINDINGS: Urinary Tract: Limited evaluation of bilateral kidneys on coronal T2 weighted images. No hydroureteronephrosis. No suspicious renal mass. Urinary bladder is partially distended and appears within normal limits. Bowel: No disproportionate dilation of small or large bowel loops. Unremarkable appendix.  Vascular/Lymphatic: There are several subcentimeter sized left internal iliac group of lymph nodes, with largest measuring up to 8 x 10 mm (series 4, image 21). These are nonspecific. No significant vascular abnormality seen. Reproductive: Slightly bulky anteverted uterus measuring up to 5.1 x 5.6 cm orthogonally on sagittal plane. No suspicious uterine lesion. There is diffusely thickened junctional zone measuring up to 1.6-1.8 cm. There is a sub 5 mm subendometrial cyst in the right fundal region. The endometrium is thickened measuring up to 11-12 mm. No focal lesion. There is a fairly well-circumscribed T2 hyperintense 2.7 x 3.1 cm lesion arising from the posterior wall of the cervix. The lesion reaches up to the external os. The overlying parametrium is intact. There are multiple subcentimeter sized nabothian cysts in the anterior cervix. There are several T1 hyperintense and T2 mildly hyperintense structure within the right ovary with largest measuring up to 8 x 13 mm. No enhancement on the postcontrast images. These are favored to represent endometriomas. Adjacent to the right ovary, there is a well-circumscribed 3.8 x 5.3 cm, T1 hyperintense and T2 hypointense nonenhancing structure, which is exophytically arises from the left ovary. This is favored to represent endometrioma in the left ovary. Vagina is within normal limits.  No suspicious mass. Other:  None. Musculoskeletal: No suspicious bone lesions identified. IMPRESSION: 1. There is a fairly well-circumscribed T2 hyperintense lesion arising from the posterior wall of the cervix reaching up to the external os. The overlying parametrium is intact. No extension into the vagina, pelvic sidewall, lower uterine segment or urinary bladder. This is compatible with provided history of cervical carcinoma. 2. There are several subcentimeter sized left internal iliac group of lymph nodes, with largest measuring up to 8 x 10 mm. These are nonspecific. 3. There is a  3.8 x 5.3 cm endometrioma in the left ovary. There are several endometriomas in the right ovary with largest measuring up to 8 x 13 mm. 4. There is diffuse thickening of the junctional zone, compatible with diffuse uterine adenomyosis. Electronically Signed   By: Ree Molt M.D.   On: 12/07/2024 15:25   US  PELVIC COMPLETE WITH TRANSVAGINAL Result Date: 11/19/2024 EXAM: US  Pelvis, Complete Transvaginal and Transabdominal without Doppler TECHNIQUE: Transabdominal and transvaginal pelvic duplex ultrasound using B-mode/gray scaled imaging without Doppler spectral analysis and color flow was obtained. COMPARISON: None provided CLINICAL HISTORY: Vaginal bleeding, anemia. FINDINGS: UTERUS: Uterus measures 9.2 x 4.8 x 6.1 cm. Uterus demonstrates normal myometrial echotexture. There is a 3.5 cm focal hypodensity area within the cervical region measuring 3.3 x 2.2 x 5.0 cm. Additionally, there are nabothian cysts within the cervix. ENDOMETRIAL STRIPE: Endometrium measures 0.9 cm. Endometrial stripe is within normal limits. RIGHT OVARY: Right ovary measures 3.9 x 2.8 x 2.6 cm. There are cystic areas in the right ovary measuring up to 2.3 cm. LEFT OVARY: Left ovary measures 5.8 x 3.9 x 4.4 cm. There is a well circumscribed homogeneous isoechoic  structure within the left ovary measuring 5.2 x 3.2 x 3.5 cm without significant internal vascularity. FREE FLUID: No free fluid. IMPRESSION: 1. Indeterminate well-circumscribed homogeneous avascular left ovarian lesion measuring 5.2 x 3.2 x 3.5 cm, with pelvic MRI with IV contrast recommended for further characterization. 2. Indeterminate cervical region lesion measuring up to 5.0 cm, with gynecology evaluation and/or pelvic MRI with IV contrast recommended for further characterization. 3. Small right ovarian cysts measuring up to 2.3 cm, compatible with follicles and requiring no follow-up imaging. Electronically signed by: Greig Pique MD 11/19/2024 12:39 AM EST RP  Workstation: [id number]      IMPRESSION: At least Stage IB3 cervical cancer, pending full staging work-up  We reviewed this patient's current workup.  She presents today with at least stage IB3 cervical cancer.  PET is scheduled for 12/21/2023 to complete staging workup.  She is not an upfront surgical candidate.  Consensus for treatment is to proceed with concurrent chemoradiation followed by brachytherapy and consideration of surgery.  If patient is not a candidate for surgery, Dr. Shannon recommends 5 fractions of brachytherapy.  If she is a surgical candidate, a single fraction of brachytherapy is recommended.  Today, I talked to the patient and family about the findings and work-up thus far.  We discussed the natural history of cervical cancer and general treatment, highlighting the role of radiotherapy in the management.  We discussed the available radiation techniques, and focused on the details of logistics and delivery.  We reviewed the anticipated acute and late sequelae associated with radiation in this setting.  The patient was encouraged to ask questions that I answered to the best of my ability. A patient consent form was discussed and signed.  We retained a copy for our records.  The patient would like to proceed with radiation and will be scheduled for CT simulation.   PLAN: Patient is scheduled for PET imaging on 12/21/2023. We will wait the results of the PET to finalize treatment plans. We anticipate concurrent chemoRT followed by brachytherapy +/- surgery, pending treatment response.     We spent 70 minutes face to face with the patient and more than 50% of that time was spent in counseling and/or coordination of care.  As previously stated, interpreter was present today.    ------------------------------------------------   Leeroy Due, PA-C   Lynwood CHARM Shannon, PhD, MD   [facility]  Radiation Oncology Direct Dial: [phone]  Fax: [phone] [email or web address]            "

## 2024-12-14 NOTE — Progress Notes (Incomplete)
 " Radiation Oncology         [phone] ________________________________  Initial Outpatient Consultation  Name: Yolanda Braun MRN: [id number]  Date: 12/14/2024  DOB: 1981-08-24  RR:[id number], No Pcp Per  Viktoria Comer SAUNDERS, MD   REFERRING PHYSICIAN: Viktoria Comer SAUNDERS, MD  DIAGNOSIS: There were no encounter diagnoses. ***  At least Stage IB3 cervical cancer, pending full staging work-up  HISTORY OF PRESENT ILLNESS: Yolanda Braun is a 44 y.o. female who is being seen at the request of Dr. Viktoria for newly diagnosed cervical cancer.  The patient originally presented to the emergency department on 11/18/2024 with a 4-day history of heavy vaginal bleeding with passage of clots larger than normal.  She was subsequently admitted. Hemoglobin at that time was noted to be 5.4.  On physical exam the cervix was noted to be bulky with a posterior lesion consistent with cervical cancer.  Pelvic ultrasound showed a uterus measuring 9.2 cm;  A 3.5 cm focal hypodensity within the cervical region; endometrial lining measuring 9 mm; a right ovary measuring up to 3.9 cm; a left ovary measuring up to 5.8 cm; and no free fluid. 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  The cervical lesion was not bleeding at that time and the patient received IV Premarin  and was started on Megace .  She also received 2 units of packed red blood cells.  Her hemoglobin on the day of discharge was 7.6.  The patient met with Dr. Viktoria on 11/25/2024 to discuss her current workup.  On exam, she noted no definitive parametrial involvement nor upper vaginal involvement.  Her tumor was noted to be quite large and replacing much of the cervix. Endometrial biopsy performed that day was negative for endometrial intraepithelial neoplasia and malignancy. Dr. Viktoria did not think the patient was a upfront surgical  candidate. She recommended planning for definitive radiation with sensitizing cisplatin and consideration of brachytherapy with interval hysterectomy, depending on her response to treatment.   She met with Dr. Lonn on 11/25/2024 for her cervical cancer as well as severe iron deficiency anemia. Given that she had not finished her workup, Dr. Lonn recommended further discussion after imaging studies had been done. She attributed the anemia to likely be due to chronic bleeding and recommended IV iron infusion.   MRI of the pelvis on 12/07/2024 demonstrated: a lesion arising from the posterior wall of the cervix reaching up to the external os, with the overlying parametrium to be intact; no extension in the vagina, pelvic sidewall, lower uterine segment or urinary bladder several subcentimeter sized left internal iliac group of lymph nodes, with the largest measuring up to 10 mm were seen; a 5.3 cm in the left ovary with several endometriomas in the right ovary also seen; diffuse thickening of the junctional zone, compatible with diffuse uterine adenomyosis.   PET is scheduled for 12/20/2024.      PREVIOUS RADIATION THERAPY: {EXAM; YES/NO:19492::No}  PAST MEDICAL HISTORY:  has a past medical history of Anemia and Cervical cancer (HCC).    PAST SURGICAL HISTORY:No past surgical history on file.  FAMILY HISTORY: family history includes Diabetes in her mother; Heart disease in her mother; Hyperlipidemia in her father.  SOCIAL HISTORY:  reports that she has never smoked. She has never used smokeless tobacco. She reports current alcohol use of about 6.0 standard drinks of alcohol per week. She reports that she does not use drugs.  ALLERGIES: Patient has no known allergies.  MEDICATIONS:  Current Outpatient Medications  Medication Sig Dispense Refill   acetaminophen (TYLENOL) 500 MG tablet Take 1,000 mg by mouth every 6 (six) hours as needed for headache or mild pain (pain score 1-3).      ibuprofen (ADVIL) 200 MG tablet Take 600 mg by mouth every 6 (six) hours as needed for headache, mild pain (pain score 1-3) or cramping.     megestrol  (MEGACE ) 40 MG tablet Take 3 tablets (120 mg total) by mouth daily. 90 tablet 1   OVER THE COUNTER MEDICATION Take 1 Scoop by mouth in the morning. BLOOM GREENS AND SUPERFOODS: for digestion, bloating, and energy     No current facility-administered medications for this visit.    REVIEW OF SYSTEMS:  A 15 point review of systems is documented in the electronic medical record. This was obtained by the nursing staff. However, I reviewed this with the patient to discuss relevant findings and make appropriate changes.  {Ros - complete:30496}   PHYSICAL EXAM:  vitals were not taken for this visit.   General: Alert and oriented, in no acute distress HEENT: Head is normocephalic. Extraocular movements are intact. Oropharynx is clear. Neck: Neck is supple, no palpable cervical or supraclavicular lymphadenopathy. Heart: Regular in rate and rhythm with no murmurs, rubs, or gallops. Chest: Clear to auscultation bilaterally, with no rhonchi, wheezes, or rales. Abdomen: Soft, nontender, nondistended, with no rigidity or guarding. Extremities: No cyanosis or edema. Lymphatics: see Neck Exam Skin: No concerning lesions. Musculoskeletal: symmetric strength and muscle tone throughout. Neurologic: Cranial nerves II through XII are grossly intact. No obvious focalities. Speech is fluent. Coordination is intact. Psychiatric: Judgment and insight are intact. Affect is appropriate.   ECOG = ***  0 - Asymptomatic (Fully active, able to carry on all predisease activities without restriction)  1 - Symptomatic but completely ambulatory (Restricted in physically strenuous activity but ambulatory and able to carry out work of a light or sedentary nature. For example, light housework, office work)  2 - Symptomatic, <50% in bed during the day (Ambulatory and capable of  all self care but unable to carry out any work activities. Up and about more than 50% of waking hours)  3 - Symptomatic, >50% in bed, but not bedbound (Capable of only limited self-care, confined to bed or chair 50% or more of waking hours)  4 - Bedbound (Completely disabled. Cannot carry on any self-care. Totally confined to bed or chair)  5 - Death   Raylene MM, Creech RH, Tormey DC, et al. [phone]). Toxicity and response criteria of the [facility]. Am. DOROTHA Bridges. Oncol. 5 (6): 649-55  LABORATORY DATA:  Lab Results  Component Value Date   WBC 7.2 11/25/2024   HGB 8.2 (L) 11/25/2024   HCT 28.5 (L) 11/25/2024   MCV 70.4 (L) 11/25/2024   PLT 301 11/25/2024   NEUTROABS 5.2 11/25/2024   Lab Results  Component Value Date   NA 136 11/18/2024   K 3.7 11/18/2024   CL 103 11/18/2024   CO2 24 11/18/2024   GLUCOSE 102 (H) 11/18/2024   BUN 9 11/18/2024   CREATININE 0.42 (L) 11/18/2024   CALCIUM 8.7 (L) 11/18/2024      RADIOGRAPHY: MR Pelvis W Wo Contrast Result Date: 12/07/2024 CLINICAL DATA:  Cervical cancer, staging. EXAM: MRI PELVIS WITHOUT AND WITH CONTRAST TECHNIQUE: Multiplanar multisequence MR imaging of the pelvis was performed both before and after administration of intravenous contrast. CONTRAST:  10mL  GADAVIST  GADOBUTROL  1 MMOL/ML IV SOLN COMPARISON:  Pelvic ultrasound from 11/19/2024. FINDINGS: Urinary Tract: Limited evaluation of bilateral kidneys on coronal T2 weighted images. No hydroureteronephrosis. No suspicious renal mass. Urinary bladder is partially distended and appears within normal limits. Bowel: No disproportionate dilation of small or large bowel loops. Unremarkable appendix. Vascular/Lymphatic: There are several subcentimeter sized left internal iliac group of lymph nodes, with largest measuring up to 8 x 10 mm (series 4, image 21). These are nonspecific. No significant vascular abnormality seen. Reproductive: Slightly bulky anteverted uterus  measuring up to 5.1 x 5.6 cm orthogonally on sagittal plane. No suspicious uterine lesion. There is diffusely thickened junctional zone measuring up to 1.6-1.8 cm. There is a sub 5 mm subendometrial cyst in the right fundal region. The endometrium is thickened measuring up to 11-12 mm. No focal lesion. There is a fairly well-circumscribed T2 hyperintense 2.7 x 3.1 cm lesion arising from the posterior wall of the cervix. The lesion reaches up to the external os. The overlying parametrium is intact. There are multiple subcentimeter sized nabothian cysts in the anterior cervix. There are several T1 hyperintense and T2 mildly hyperintense structure within the right ovary with largest measuring up to 8 x 13 mm. No enhancement on the postcontrast images. These are favored to represent endometriomas. Adjacent to the right ovary, there is a well-circumscribed 3.8 x 5.3 cm, T1 hyperintense and T2 hypointense nonenhancing structure, which is exophytically arises from the left ovary. This is favored to represent endometrioma in the left ovary. Vagina is within normal limits.  No suspicious mass. Other:  None. Musculoskeletal: No suspicious bone lesions identified. IMPRESSION: 1. There is a fairly well-circumscribed T2 hyperintense lesion arising from the posterior wall of the cervix reaching up to the external os. The overlying parametrium is intact. No extension into the vagina, pelvic sidewall, lower uterine segment or urinary bladder. This is compatible with provided history of cervical carcinoma. 2. There are several subcentimeter sized left internal iliac group of lymph nodes, with largest measuring up to 8 x 10 mm. These are nonspecific. 3. There is a 3.8 x 5.3 cm endometrioma in the left ovary. There are several endometriomas in the right ovary with largest measuring up to 8 x 13 mm. 4. There is diffuse thickening of the junctional zone, compatible with diffuse uterine adenomyosis. Electronically Signed   By: Ree Molt M.D.   On: 12/07/2024 15:25   US  PELVIC COMPLETE WITH TRANSVAGINAL Result Date: 11/19/2024 EXAM: US  Pelvis, Complete Transvaginal and Transabdominal without Doppler TECHNIQUE: Transabdominal and transvaginal pelvic duplex ultrasound using B-mode/gray scaled imaging without Doppler spectral analysis and color flow was obtained. COMPARISON: None provided CLINICAL HISTORY: Vaginal bleeding, anemia. FINDINGS: UTERUS: Uterus measures 9.2 x 4.8 x 6.1 cm. Uterus demonstrates normal myometrial echotexture. There is a 3.5 cm focal hypodensity area within the cervical region measuring 3.3 x 2.2 x 5.0 cm. Additionally, there are nabothian cysts within the cervix. ENDOMETRIAL STRIPE: Endometrium measures 0.9 cm. Endometrial stripe is within normal limits. RIGHT OVARY: Right ovary measures 3.9 x 2.8 x 2.6 cm. There are cystic areas in the right ovary measuring up to 2.3 cm. LEFT OVARY: Left ovary measures 5.8 x 3.9 x 4.4 cm. There is a well circumscribed homogeneous isoechoic structure within the left ovary measuring 5.2 x 3.2 x 3.5 cm without significant internal vascularity. FREE FLUID: No free fluid. IMPRESSION: 1. Indeterminate well-circumscribed homogeneous avascular left ovarian lesion measuring 5.2 x 3.2 x 3.5 cm, with pelvic MRI with IV contrast  recommended for further characterization. 2. Indeterminate cervical region lesion measuring up to 5.0 cm, with gynecology evaluation and/or pelvic MRI with IV contrast recommended for further characterization. 3. Small right ovarian cysts measuring up to 2.3 cm, compatible with follicles and requiring no follow-up imaging. Electronically signed by: Greig Pique MD 11/19/2024 12:39 AM EST RP Workstation: [id number]      IMPRESSION: ***  PLAN:***    I spent {CHL ONC TIME VISIT - DTPQU:[id number]} minutes face to face with the patient and more than 50% of that time was spent in counseling and/or coordination of care.    ------------------------------------------------   Leeroy Due, PA-C   Lynwood CHARM Nasuti, PhD, MD   [facility]  Radiation Oncology Direct Dial: [phone]  Fax: [phone] .com           "

## 2024-12-15 ENCOUNTER — Encounter: Payer: Self-pay | Admitting: Radiation Oncology

## 2024-12-15 ENCOUNTER — Ambulatory Visit
Admission: RE | Admit: 2024-12-15 | Discharge: 2024-12-15 | Disposition: A | Discharge status: Home / Self Care | Payer: Self-pay | Source: Ambulatory Visit | Attending: Radiation Oncology | Admitting: Radiation Oncology

## 2024-12-15 VITALS — BP 144/93 | HR 110 | Temp 97.7°F | Resp 18 | Ht 60.5 in | Wt 220.0 lb

## 2024-12-15 DIAGNOSIS — C539 Malignant neoplasm of cervix uteri, unspecified: Secondary | ICD-10-CM

## 2024-12-20 ENCOUNTER — Ambulatory Visit (HOSPITAL_COMMUNITY)
Admission: RE | Admit: 2024-12-20 | Discharge: 2024-12-20 | Disposition: A | Discharge status: Home / Self Care | Payer: Self-pay | Source: Ambulatory Visit | Attending: Gynecologic Oncology | Admitting: Gynecologic Oncology

## 2024-12-20 DIAGNOSIS — C539 Malignant neoplasm of cervix uteri, unspecified: Secondary | ICD-10-CM | POA: Insufficient documentation

## 2024-12-20 LAB — GLUCOSE, CAPILLARY: Glucose-Capillary: 98 mg/dL (ref 70–99)

## 2024-12-20 MED ORDER — FLUDEOXYGLUCOSE F - 18 (FDG) INJECTION
10.8600 | Freq: Once | INTRAVENOUS | Status: AC
  Administered 2024-12-20: 10.86 via INTRAVENOUS

## 2024-12-22 ENCOUNTER — Telehealth: Payer: Self-pay

## 2024-12-22 ENCOUNTER — Telehealth: Payer: Self-pay | Admitting: Oncology

## 2024-12-22 ENCOUNTER — Inpatient Hospital Stay: Payer: Self-pay

## 2024-12-22 NOTE — Telephone Encounter (Signed)
 Called Chastidy with the assistance of [employer].  Advised her of appointments on 01/05/2025: 10:40 appointment with Dr. Lonn, 12:00 labs and 2:00 Patient Education class.  She verbalized understanding and agreement of the appointments.

## 2024-12-22 NOTE — Telephone Encounter (Signed)
 Called thru [employer] to offer earlier appt with Dr. Lonn on 2/3, she declined earlier appt and will keep appts as scheduled.

## 2024-12-28 NOTE — Telephone Encounter (Signed)
 Spoke with patient through [employer] id [phone] and relayed result message from Dr. Viktoria that patient's PET scan does not show definitive spread of cancer outside of her cervix. Pt verbalized understanding, was pleased and thanked the office for calling.

## 2024-12-28 NOTE — Telephone Encounter (Signed)
-----   Message from Comer Dollar, MD sent at 12/28/2024  4:13 PM EST ----- Could you please let this patient know that her PET scan does not show definitive spread of cancer outside of her cervix? Thank you

## 2025-01-05 ENCOUNTER — Inpatient Hospital Stay: Payer: Self-pay | Attending: Hematology and Oncology | Admitting: Hematology and Oncology

## 2025-01-05 ENCOUNTER — Inpatient Hospital Stay: Payer: Self-pay
